# Patient Record
Sex: Female | Born: 1965 | Race: Black or African American | Hispanic: No | Smoking: Never smoker
Health system: Southern US, Community
[De-identification: ages and names within clinical notes are randomized; demographics above are authoritative.]

## PROBLEM LIST (undated history)

## (undated) DIAGNOSIS — M1990 Unspecified osteoarthritis, unspecified site: Secondary | ICD-10-CM

## (undated) DIAGNOSIS — E559 Vitamin D deficiency, unspecified: Secondary | ICD-10-CM

## (undated) DIAGNOSIS — J302 Other seasonal allergic rhinitis: Secondary | ICD-10-CM

## (undated) DIAGNOSIS — L309 Dermatitis, unspecified: Secondary | ICD-10-CM

## (undated) DIAGNOSIS — I10 Essential (primary) hypertension: Secondary | ICD-10-CM

## (undated) DIAGNOSIS — E669 Obesity, unspecified: Secondary | ICD-10-CM

## (undated) DIAGNOSIS — K219 Gastro-esophageal reflux disease without esophagitis: Secondary | ICD-10-CM

## (undated) DIAGNOSIS — E7800 Pure hypercholesterolemia, unspecified: Secondary | ICD-10-CM

## (undated) HISTORY — PX: TONSILLECTOMY: SUR1361

## (undated) HISTORY — DX: Dermatitis, unspecified: L30.9

## (undated) HISTORY — PX: TUBAL LIGATION: SHX77

---

## 2008-07-23 ENCOUNTER — Emergency Department (HOSPITAL_BASED_OUTPATIENT_CLINIC_OR_DEPARTMENT_OTHER): Admission: EM | Admit: 2008-07-23 | Discharge: 2008-07-23 | Payer: Self-pay | Admitting: Emergency Medicine

## 2009-12-04 ENCOUNTER — Emergency Department (HOSPITAL_BASED_OUTPATIENT_CLINIC_OR_DEPARTMENT_OTHER): Admission: EM | Admit: 2009-12-04 | Discharge: 2009-12-05 | Payer: Self-pay | Admitting: Emergency Medicine

## 2011-07-21 LAB — COMPREHENSIVE METABOLIC PANEL
BUN: 13
Calcium: 9.3
Chloride: 104
Creatinine, Ser: 0.8
GFR calc non Af Amer: >60
Glucose, Bld: 89
Potassium: 3.9

## 2011-07-21 LAB — URINALYSIS, ROUTINE W REFLEX MICROSCOPIC
Bilirubin Urine: NEGATIVE
Hgb urine dipstick: NEGATIVE
Nitrite: NEGATIVE
Protein, ur: NEGATIVE
Specific Gravity, Urine: 1.017
Urobilinogen, UA: 1

## 2011-07-21 LAB — DIFFERENTIAL
Basophils Absolute: 0
Eosinophils Relative: 1
Lymphocytes Relative: 42
Neutro Abs: 3.6

## 2011-07-21 LAB — CBC
HCT: 36.2
Platelets: 255
RDW: 13.1
WBC: 7.1

## 2011-07-21 LAB — POCT CARDIAC MARKERS: Troponin i, poc: <0.05

## 2011-07-21 LAB — MAGNESIUM: Magnesium: 2.1

## 2013-03-16 ENCOUNTER — Encounter (HOSPITAL_BASED_OUTPATIENT_CLINIC_OR_DEPARTMENT_OTHER): Payer: Self-pay | Admitting: Emergency Medicine

## 2013-03-16 ENCOUNTER — Emergency Department (HOSPITAL_BASED_OUTPATIENT_CLINIC_OR_DEPARTMENT_OTHER): Payer: BC Managed Care – PPO

## 2013-03-16 ENCOUNTER — Emergency Department (HOSPITAL_BASED_OUTPATIENT_CLINIC_OR_DEPARTMENT_OTHER)
Admission: EM | Admit: 2013-03-16 | Discharge: 2013-03-16 | Disposition: A | Discharge status: Home / Self Care | Payer: BC Managed Care – PPO | Attending: Emergency Medicine | Admitting: Emergency Medicine

## 2013-03-16 DIAGNOSIS — R0989 Other specified symptoms and signs involving the circulatory and respiratory systems: Secondary | ICD-10-CM | POA: Insufficient documentation

## 2013-03-16 DIAGNOSIS — M25529 Pain in unspecified elbow: Secondary | ICD-10-CM | POA: Insufficient documentation

## 2013-03-16 DIAGNOSIS — R079 Chest pain, unspecified: Secondary | ICD-10-CM | POA: Insufficient documentation

## 2013-03-16 DIAGNOSIS — Z79899 Other long term (current) drug therapy: Secondary | ICD-10-CM | POA: Insufficient documentation

## 2013-03-16 DIAGNOSIS — R062 Wheezing: Secondary | ICD-10-CM | POA: Insufficient documentation

## 2013-03-16 DIAGNOSIS — K219 Gastro-esophageal reflux disease without esophagitis: Secondary | ICD-10-CM | POA: Insufficient documentation

## 2013-03-16 DIAGNOSIS — Z8639 Personal history of other endocrine, nutritional and metabolic disease: Secondary | ICD-10-CM | POA: Insufficient documentation

## 2013-03-16 DIAGNOSIS — J40 Bronchitis, not specified as acute or chronic: Secondary | ICD-10-CM | POA: Insufficient documentation

## 2013-03-16 DIAGNOSIS — R0602 Shortness of breath: Secondary | ICD-10-CM | POA: Insufficient documentation

## 2013-03-16 HISTORY — DX: Vitamin D deficiency, unspecified: E55.9

## 2013-03-16 HISTORY — DX: Gastro-esophageal reflux disease without esophagitis: K21.9

## 2013-03-16 HISTORY — DX: Other seasonal allergic rhinitis: J30.2

## 2013-03-16 LAB — CBC WITH DIFFERENTIAL/PLATELET
Basophils Absolute: 0 10*3/uL (ref 0.0–0.1)
Eosinophils Absolute: 0.1 10*3/uL (ref 0.0–0.7)
Hemoglobin: 11.4 g/dL — ABNORMAL LOW (ref 12.0–15.0)
Lymphocytes Relative: 57 % — ABNORMAL HIGH (ref 12–46)
MCH: 29.1 pg (ref 26.0–34.0)
MCHC: 33.6 g/dL (ref 30.0–36.0)
Monocytes Absolute: 0.5 10*3/uL (ref 0.1–1.0)
Monocytes Relative: 8 % (ref 3–12)
Neutrophils Relative %: 33 % — ABNORMAL LOW (ref 43–77)
RBC: 3.92 MIL/uL (ref 3.87–5.11)
RDW: 13.3 % (ref 11.5–15.5)

## 2013-03-16 LAB — BASIC METABOLIC PANEL
BUN: 10 mg/dL (ref 6–23)
Chloride: 102 mEq/L (ref 96–112)
GFR calc Af Amer: >90 mL/min (ref >90)
Glucose, Bld: 104 mg/dL — ABNORMAL HIGH (ref 70–99)
Sodium: 138 mEq/L (ref 135–145)

## 2013-03-16 LAB — TROPONIN I: Troponin I: <0.3 ng/mL (ref <0.30)

## 2013-03-16 MED ORDER — HYDROCODONE-HOMATROPINE 5-1.5 MG/5ML PO SYRP: 5.0000 mL | ORAL_SOLUTION | Freq: Four times a day (QID) | ORAL | Status: DC | PRN

## 2013-03-16 MED ORDER — ALBUTEROL SULFATE HFA 108 (90 BASE) MCG/ACT IN AERS
2.0000 | INHALATION_SPRAY | RESPIRATORY_TRACT | Status: DC | PRN
  Administered 2013-03-16: 2 via RESPIRATORY_TRACT
  Filled 2013-03-16: qty 6.7

## 2013-03-16 NOTE — ED Notes (Signed)
MD at bedside giving results and plan of care.

## 2013-03-16 NOTE — ED Provider Notes (Signed)
History     CSN: [id number]  Arrival date & time 03/16/13  1722   First MD Initiated Contact with Patient 03/16/13 1738      Chief Complaint  Patient presents with  . Cough  . Chest Pain    (Consider location/radiation/quality/duration/timing/severity/associated sxs/prior treatment) Patient is a 47 y.o. Jennifer [name] presenting with cough and chest pain.  Cough Associated symptoms: chest pain   Chest Pain Associated symptoms: cough    Pt reports 2 weeks of chest congestion, dry cough and occasional SOB. She has not had fever, but today at work began to have moderate chest soreness, worse with deep breath and coughing. She denies any history of CAD. No HTN or DM. She also reports mild L elbow pain related to lifting at work, but not related to her chief complaint today.  Past Medical History  Diagnosis Date  . Vitamin D deficiency   . GERD (gastroesophageal reflux disease)   . Seasonal allergies     Past Surgical History  Procedure Laterality Date  . Cesarean section    . Tubal ligation      No family history on file.  History  Substance Use Topics  . Smoking status: Passive Smoke Exposure - Never Smoker  . Smokeless tobacco: Not on file  . Alcohol Use: No    OB History   Grav Para Term Preterm Abortions TAB SAB Ect Mult Living                  Review of Systems  Respiratory: Positive for cough.   Cardiovascular: Positive for chest pain.   All other systems reviewed and are negative except as noted in HPI.   Allergies  Review of patient's allergies indicates no known allergies.  Home Medications   Current Outpatient Rx  Name  Route  Sig  Dispense  Refill  . ranitidine (ZANTAC) 75 MG tablet   Oral   Take 75 mg by mouth daily.           BP 155/90  Pulse 88  Temp(Src) 98 F (36.7 C) (Oral)  Resp 24  Ht 4\' 11"  (1.499 m)  Wt 321 lb (145.605 kg)  BMI 64.8 kg/m2  SpO2 100%  LMP 02/21/2013  Physical Exam  Nursing note and vitals  reviewed. Constitutional: She is oriented to person, place, and time. She appears well-developed and well-nourished.  HENT:  Head: Normocephalic and atraumatic.  Eyes: EOM are normal. Pupils are equal, round, and reactive to light.  Neck: Normal range of motion. Neck supple.  Cardiovascular: Normal rate, normal heart sounds and intact distal pulses.   Pulmonary/Chest: Effort normal. She has wheezes. She has no rales.  Abdominal: Bowel sounds are normal. She exhibits no distension. There is no tenderness.  Musculoskeletal: Normal range of motion. She exhibits no edema and no tenderness.  Neurological: She is alert and oriented to person, place, and time. She has normal strength. No cranial nerve deficit or sensory deficit.  Skin: Skin is warm and dry. No rash noted.  Psychiatric: She has a normal mood and affect.    ED Course  Procedures (including critical care time)  Labs Reviewed  CBC WITH DIFFERENTIAL - Abnormal; Notable for the following:    Hemoglobin 11.4 (*)    HCT 33.9 (*)    Neutrophils Relative % 33 (*)    Lymphocytes Relative 57 (*)    All other components within normal limits  BASIC METABOLIC PANEL - Abnormal; Notable for the following:    Glucose,  Bld 104 (*)    All other components within normal limits  TROPONIN I   Dg Chest 2 View  03/16/2013   *RADIOLOGY REPORT*  Clinical Data: Dry cough.  Anterior chest pain.  CHEST - 2 VIEW  Comparison: 07/23/2008  Findings: Low lung volumes are present, causing crowding of the pulmonary vasculature.  Elevated cardiothoracic index at 56%, at least partially due to the low lung volumes.  No specific airspace opacity or pleural effusion identified.  IMPRESSION:  1.  Very low lung volumes likely contribute to the mildly prominent appearance of the heart.  Vascular crowding due to low lung volumes.  No specific acute findings.   Original Report Authenticated By: Gaylyn Rong, M.D.     1. Bronchitis       MDM   Date:  03/16/2013  Rate: 83  Rhythm: normal sinus rhythm  QRS Axis: normal  Intervals: normal  ST/T Wave abnormalities: normal  Conduction Disutrbances: none  Narrative Interpretation: unremarkable   Pt feeling much better after inhaler. Labs and imaging as above unremarkable. Likely a viral bronchitis. Given information regarding expected course, Albuterol if needed, cough medicine at night.         Charles B. Bernette Mayers, MD 03/16/13 2012

## 2013-03-16 NOTE — ED Notes (Signed)
Dry cough x2 weeks. Sharp Pain in upper sternum today. Worse with deep breath and cough.  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 Also some left elbow pain with movement of left arm.,

## 2013-03-25 DIAGNOSIS — M545 Low back pain: Secondary | ICD-10-CM | POA: Insufficient documentation

## 2013-07-14 DIAGNOSIS — K219 Gastro-esophageal reflux disease without esophagitis: Secondary | ICD-10-CM | POA: Insufficient documentation

## 2013-11-24 DIAGNOSIS — E559 Vitamin D deficiency, unspecified: Secondary | ICD-10-CM | POA: Insufficient documentation

## 2014-12-28 DIAGNOSIS — I10 Essential (primary) hypertension: Secondary | ICD-10-CM | POA: Insufficient documentation

## 2015-08-22 DIAGNOSIS — E7800 Pure hypercholesterolemia, unspecified: Secondary | ICD-10-CM | POA: Insufficient documentation

## 2016-06-26 ENCOUNTER — Encounter: Payer: 59 | Admitting: Allergy & Immunology

## 2016-06-26 ENCOUNTER — Encounter: Payer: Self-pay | Admitting: Allergy & Immunology

## 2016-06-26 ENCOUNTER — Ambulatory Visit (INDEPENDENT_AMBULATORY_CARE_PROVIDER_SITE_OTHER): Payer: 59 | Admitting: Allergy & Immunology

## 2016-06-26 VITALS — BP 134/82 | HR 71 | Temp 98.0°F | Resp 16 | Ht 60.04 in | Wt 315.0 lb

## 2016-06-26 DIAGNOSIS — L259 Unspecified contact dermatitis, unspecified cause: Secondary | ICD-10-CM

## 2016-06-26 DIAGNOSIS — J310 Chronic rhinitis: Secondary | ICD-10-CM | POA: Diagnosis not present

## 2016-06-26 DIAGNOSIS — L253 Unspecified contact dermatitis due to other chemical products: Secondary | ICD-10-CM | POA: Diagnosis not present

## 2016-06-26 DIAGNOSIS — Z91040 Latex allergy status: Secondary | ICD-10-CM

## 2016-06-26 MED ORDER — AZELASTINE-FLUTICASONE 137-50 MCG/ACT NA SUSP: 2.0000 | NASAL | 5 refills | Status: DC

## 2016-06-26 MED ORDER — EPINEPHRINE 0.3 MG/0.3ML IJ SOAJ: 0.3000 mg | Freq: Once | INTRAMUSCULAR | 2 refills | Status: AC

## 2016-06-26 NOTE — Patient Instructions (Addendum)
1. Latex allergy - Testing was positive to latex. - Avoid latex in all forms.  - Latex can cross react with certain fruits, so be careful when you eat them (bananas, avacadoes, etc) - EpiPen prescribed.  2. Chronic rhinitis - Testing was positive to grasses, weeds, trees, molds, dust mites, cat, dog, cockroach - Start Dymista one spray per nostril twice daily (contains nasal steroid and nasal antihistamine) - Continue cetirizine 10mg  daily.  3. Contact dermatitis - We will place the patch testing on Monday.  - Come back on Wednesday and next Friday for a reading.  - We will give you more information on any positives at that time.  4. Return in about 4 weeks (around 07/24/2016). Also come back next Monday (06/29/16) for placement, Wednesday (07/01/16), and Friday (07/03/16) for readings.   Please inform us of any Emergency Department visits, hospitalizations, or changes in symptoms. Call us before going to the ED for breathing or allergy symptoms since we might be able to fit you in for a sick visit. Feel free to contact us anytime with any questions, problems, or concerns.  It was a pleasure to meet you today!   Control of House Dust Mite Allergen  House dust mites play a major role in allergic asthma and rhinitis.  They occur in environments with high humidity wherever human skin, the food for dust mites is found. High levels have been detected in dust obtained from mattresses, pillows, carpets, upholstered furniture, bed covers, clothes and soft toys.  The principal allergen of the house dust mite is found in its feces.  A gram of dust may contain 1,000 mites and 250,000 fecal particles.  Mite antigen is easily measured in the air during house cleaning activities.    1. Encase mattresses, including the box spring, and pillow, in an air tight cover.  Seal the zipper end of the encased mattresses with wide adhesive tape. 2. Wash the bedding in water of 130 degrees Farenheit weekly.  Avoid  cotton comforters/quilts and flannel bedding: the most ideal bed covering is the dacron comforter. 3. Remove all upholstered furniture from the bedroom. 4. Remove carpets, carpet padding, rugs, and non-washable window drapes from the bedroom.  Wash drapes weekly or use plastic window coverings. 5. Remove all non-washable stuffed toys from the bedroom.  Wash stuffed toys weekly. 6. Have the room cleaned frequently with a vacuum cleaner and a damp dust-mop.  The patient should not be in a room which is being cleaned and should wait 1 hour after cleaning before going into the room. 7. Close and seal all heating outlets in the bedroom.  Otherwise, the room will become filled with dust-laden air.  An electric heater can be used to heat the room. 8. Reduce indoor humidity to less than 50%.  Do not use a humidifier.  Reducing Pollen Exposure  The American Academy of Allergy, Asthma and Immunology suggests the following steps to reduce your exposure to pollen during allergy seasons.    1. Do not hang sheets or clothing out to dry; pollen may collect on these items. 2. Do not mow lawns or spend time around freshly cut grass; mowing stirs up pollen. 3. Keep windows closed at night.  Keep car windows closed while driving. 4. Minimize morning activities outdoors, a time when pollen counts are usually at their highest. 5. Stay indoors as much as possible when pollen counts or humidity is high and on windy days when pollen tends to remain in the air longer.  6. Use air conditioning when possible.  Many air conditioners have filters that trap the pollen spores. 7. Use a HEPA room air filter to remove pollen form the indoor air you breathe.  Control of Dog or Cat Allergen  Avoidance is the best way to manage a dog or cat allergy. If you have a dog or cat and are allergic to dog or cats, consider removing the dog or cat from the home. If you have a dog or cat but don't want to find it a new home, or if your  family wants a pet even though someone in the household is allergic, here are some strategies that may help keep symptoms at bay:  1. Keep the pet out of your bedroom and restrict it to only a few rooms. Be advised that keeping the dog or cat in only one room will not limit the allergens to that room. 2. Don't pet, hug or kiss the dog or cat; if you do, wash your hands with soap and water. 3. High-efficiency particulate air (HEPA) cleaners run continuously in a bedroom or living room can reduce allergen levels over time. 4. Regular use of a high-efficiency vacuum cleaner or a central vacuum can reduce allergen levels. 5. Giving your dog or cat a bath at least once a week can reduce airborne allergen.   Control of Cockroach Allergen  Cockroach allergen has been identified as an important cause of acute attacks of asthma, especially in urban settings.  There are fifty-five species of cockroach that exist in the [place], however only three, the [place], [place] and Oriental species produce allergen that can affect patients with Asthma.  Allergens can be obtained from fecal particles, egg casings and secretions from cockroaches.    1. Remove food sources. 2. Reduce access to water. 3. Seal access and entry points. 4. Spray runways with 0.5-1% Diazinon or Chlorpyrifos 5. Blow boric acid power under stoves and refrigerator. 6. Place bait stations (hydramethylnon) at feeding sites.   Control of Mold Allergen  Mold and fungi can grow on a variety of surfaces provided certain temperature and moisture conditions exist.  Outdoor molds grow on plants, decaying vegetation and soil.  The major outdoor mold, Alternaria and Cladosporium, are found in very high numbers during hot and dry conditions.  Generally, a late Summer - Fall peak is seen for common outdoor fungal spores.  Rain will temporarily lower outdoor mold spore count, but counts rise rapidly when the rainy period ends.  The most important  indoor molds are Aspergillus and Penicillium.  Dark, humid and poorly ventilated basements are ideal sites for mold growth.  The next most common sites of mold growth are the bathroom and the kitchen.  Outdoor MicrosoftMold Control 1. Use air conditioning and keep windows closed 2. Avoid exposure to decaying vegetation. 3. Avoid leaf raking. 4. Avoid grain handling. 5. Consider wearing a face mask if working in moldy areas.  Indoor Mold Control 1. Maintain humidity below 50%. 2. Clean washable surfaces with 5% bleach solution. 3. Remove sources e.g. contaminated carpets.

## 2016-06-26 NOTE — Progress Notes (Addendum)
NEW PATIENT  Date of Service/Encounter:  06/26/16   Assessment:   Latex allergy  Chronic rhinitis  Contact dermatitis    Plan/Recommendations:    1. Latex allergy - Testing was positive to latex at the 1/100 dilution. - Avoid latex in all forms.  - Latex can cross react with certain fruits, so be careful when you eat them (bananas, avacadoes, etc) - Information on latex provided.  - EpiPen prescribed and training provided.   2. Chronic rhinitis - Testing was positive to grasses, weeds, trees, molds, dust mites, cat, dog, cockroach - Start Dymista one spray per nostril twice daily (contains nasal steroid and nasal antihistamine) - Continue cetirizine '10mg'$  daily. - Because of her multiple sensitizations, we discussed allergen immunotherapy. - She will consider immunotherapy and we will discuss further at the appointment in one month.  3. Contact dermatitis - We will place the patch testing on Monday.  - Come back on Wednesday and next Friday for a reading.  - We will give you more information on any positives at that time.  4. Return in about 4 weeks (around 07/24/2016). Also come back next Monday (06/29/16) for placement, Wednesday (07/01/16), and Friday (07/03/16) for readings.     Subjective:   Jennifer [name] is a 50 y.o. female presenting today for evaluation of  Chief Complaint  Patient presents with  . Allergic Reaction    latex,hair bonding glue  . Rash  .  Jennifer [name] has a history of the following: Patient Active Problem List   Diagnosis Date Noted  . Latex allergy 06/26/2016  . Chronic rhinitis 06/26/2016  . Contact dermatitis 06/26/2016  . Hypercholesteremia 08/22/2015  . Hypertension, essential 12/28/2014  . Morbid obesity ([place]) 11/24/2013  . Vitamin D deficiency 11/24/2013  . Esophageal reflux 07/14/2013  . Low back pain 03/25/2013    History obtained from: chart review and patient.  Jennifer [name] was referred by Reeves Dam, MD.       Jennifer [name] is a 50 y.o. female presenting for rash as well as allergic rhinitis symptoms. These symptoms have been occurring for several years. Jennifer [name] reports that she gets a rash with exposure to certain plastics. 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 With exposure to the gloves and the powder on them, she will develop a rash over her hands. She also developed a rash around her mouth when she blows up balloons. She has had shortness of breath with a couple of these episodes. They recently changed gloves at work and she does not have these reactions any longer. She also avoids balloons and other rubber items.  Jennifer [name] also reports rashes with various chemicals. Specifically, when she goes to the salon and gets her hair done, she will develop a rash on her scalp. She has found some makeups that cause no reactions, but she has reacted in the past. She endorses rashes with certain antibacterial soaps, including [place] Spring. In general, she reports that she has sensitive skin and does better with sensitive products or unscented products. She describes these rashes as pruritic and somewhat roughened. She treats with avoidance of triggers as well as hydrocortisone ointment as needed.  Jennifer [name] does have a history of allergy symptoms for the last 3-4 years. These symptoms seem to be worse in the spring and fall, although upon further questioning it seems that she has symptoms throughout the year. She takes cetirizine 10 mg daily as well as Flonase 1 spray  per nostril throughout the entire year.  Jennifer [name] has a history of hypertension. She has never needed an inhaler aside from one episode of bronchitis around 15 years ago. Otherwise, there is no history of other atopic diseases, including asthma, drug allergies, food allergies, environmental allergies, stinging insect allergies, or urticaria. There is no significant infectious history. Vaccinations are up to date.     Past Medical History: Patient Active Problem List   Diagnosis Date Noted  . Latex allergy 06/26/2016  . Chronic rhinitis 06/26/2016  . Contact dermatitis 06/26/2016  . Hypercholesteremia 08/22/2015  . Hypertension, essential 12/28/2014  . Morbid obesity ([place]) 11/24/2013  . Vitamin D deficiency 11/24/2013  . Esophageal reflux 07/14/2013  . Low back pain 03/25/2013    Medication List:    Medication List       Accurate as of 06/26/16  1:35 PM. Always use your most recent med list.          acetaminophen 500 MG tablet Commonly known as:  TYLENOL Frequency:   Dosage:0   MG  Instructions:  Note:1 tab by mouth as needed for pain every 4 hours (Tylenol),take with food.   cetirizine 10 MG chewable tablet Commonly known as:  ZYRTEC Frequency:   Dosage:0   MG  Instructions:  Note:1 tab by every evening for 10 days, the once tab in the evening as needed for allergies - DO NOT TAKE WITH LORATADINE   fluticasone 50 MCG/ACT nasal spray Commonly known as:  FLONASE Place 1 spray into both nostrils 2 (two) times daily.   hydrochlorothiazide 12.5 MG tablet Commonly known as:  HYDRODIURIL Take 12.5 mg by mouth.   HYDROcodone-homatropine 5-1.5 MG/5ML syrup Commonly known as:  HYCODAN Take 5 mLs by mouth every 6 (six) hours as needed for cough.   ibuprofen 200 MG tablet Commonly known as:  ADVIL,MOTRIN Take 200 mg by mouth.   VITAMIN D PO Take by mouth.       Birth History: non-contributory. She was born at term without complications.  Developmental History: Jennifer [name] has met all milestones on time.   Past Surgical History: Past Surgical History:  Procedure Laterality Date  . CESAREAN SECTION    . TUBAL LIGATION       Family History: Family History  Problem Relation Age of Onset  . Allergic rhinitis Sister   . Allergic rhinitis Sister   . Sinusitis Sister   . Angioedema Neg Hx   . Eczema Neg Hx   . Urticaria Neg Hx   . Immunodeficiency Neg Hx      Social  History: Jennifer [name] lives at home with her husband. Their house is 9 years old. There is limited in carpet in the main living areas. There is carpeting in the bedrooms. They have gas heating and central cooling. There are no animals inside or outside the home. They do not use plastic dust mite covers for the better pillows. There is no tobacco exposure. She works as a Chemical engineer. Her current employment has been 18 years same location, but she has worked at other facilities in the past.   Review of Systems: a 14-point review of systems is pertinent for what is mentioned in HPI.  Otherwise, all other systems were negative. Constitutional: negative other than that listed in the HPI Eyes: negative other than that listed in the HPI Ears, nose, mouth, throat, and face: negative other than that listed in the HPI Respiratory: negative other than that listed in the HPI Cardiovascular: negative other than that listed  in the HPI Gastrointestinal: negative other than that listed in the HPI Genitourinary: negative other than that listed in the HPI Integument: negative other than that listed in the HPI Hematologic: negative other than that listed in the HPI Musculoskeletal: negative other than that listed in the HPI Neurological: negative other than that listed in the HPI Allergy/Immunologic: negative other than that listed in the HPI    Objective:   Blood pressure 134/82, pulse 71, temperature 98 F (36.7 C), temperature source Oral, resp. rate 16, height 5' 0.04" (1.525 m), weight (!) 315 lb 0.6 oz (142.9 kg). Body mass index is 61.45 kg/m.   Physical Exam:  General: Alert, interactive, in no acute distress. Very pleasant and boisterous obese female. HEENT: TMs pearly gray, turbinates edematous and pale with clear discharge, post-pharynx moderately erythematous. Neck: Supple without thyromegaly. Adenopathy: no enlarged lymph nodes appreciated in the anterior cervical, occipital, axillary,  epitrochlear, inguinal, or popliteal regions Lungs: Clear to auscultation without wheezing, rhonchi or rales. No increased work of breathing. No crackles. CV: Normal S1, S2 without murmurs. Capillary refill <2 seconds. Pulses 2+ upper extremities. Abdomen: Nondistended, nontender. No guarding or rebound tenderness. Difficult exam due to body habitus. Skin: Warm and dry, without lesions or rashes. There are no active lesions or urticaria today. Extremities:  No clubbing, cyanosis or edema. Neuro:   Grossly intact. No focal deficits noted.  Diagnostic studies:   Allergy Studies:   Indoor/Outdoor Environmental Percutaneous Testing: Positive to grasses, dust mites  Indoor/Outdoor Environmental Intradermal Testing: Positive to [place] and [place] grass, weed mix, tree mix, mold mix 3, mold mix 4, cat, dog, and cockroach  Latex testing: equivocal to the 10/998 dilution and positive to the 1/100 dilution with adequate controls    Salvatore Marvel, MD FAAAAI [facility] [place]  ________________________________     Follow-up Note  RE: Jennifer [name] MRN: [id number] DOB: 1966-09-20 Date of Office Visit: 06/26/2016  Primary care provider: Reeves Dam, MD Referring provider: Reeves Dam, MD   [place] returns to the office today for the initial patch test interpretation, given suspected history of contact dermatitis.    Diagnostics:  TRUE TEST 48 hour reading: Mild erythema at #15 carba mix.  Plan:   TRUE TEST patient written avoidance information given on carba mix.   Continue to keep back clean and dry.   Follow-up in 2 days for final patch test reading/interpretation.     Follow-up Note  RE: Jennifer [name]     MRN: [id number]        DOB: 1965-12-13 Date of Office Visit: 06/26/2016  Primary care provider: Reeves Dam, MD Referring provider: Reeves Dam, MD   Jennifer [name] returns to the office today for the 96 hour patch test  interpretation, given suspected history of contact dermatitis.    Diagnostics:  TRUE TEST 96 hour reading: Mild erythema at #25 (diazolidnyl urea) and #29 (imidazolidinyl urea)  Plan:   TRUE TEST patient written avoidance information given on carba mix.   Ok to wash off adhesive.  Encouraged avoidance of particular triggers.   Salvatore Marvel, MD [facility]

## 2016-06-29 ENCOUNTER — Telehealth: Payer: Self-pay | Admitting: *Deleted

## 2016-06-29 ENCOUNTER — Encounter: Payer: 59 | Admitting: Pediatrics

## 2016-06-29 NOTE — Telephone Encounter (Signed)
Pt needed a letter sent to work stating she had an allergy to latex and needed to avoid latex gloves. Letter was faxed to her work at [phone].

## 2016-07-01 ENCOUNTER — Encounter: Payer: 59 | Admitting: Allergy and Immunology

## 2016-07-01 NOTE — Addendum Note (Signed)
Addended by: Vincent PeyerKING, MICHELE A on: 07/01/2016 09:13 AM   Modules accepted: Orders

## 2016-07-03 ENCOUNTER — Encounter: Payer: 59 | Admitting: Allergy & Immunology

## 2016-07-24 ENCOUNTER — Encounter: Payer: Self-pay | Admitting: Allergy & Immunology

## 2016-07-24 ENCOUNTER — Ambulatory Visit (INDEPENDENT_AMBULATORY_CARE_PROVIDER_SITE_OTHER): Payer: 59 | Admitting: Allergy & Immunology

## 2016-07-24 VITALS — BP 146/90 | HR 92 | Temp 98.0°F | Resp 16

## 2016-07-24 DIAGNOSIS — Z91040 Latex allergy status: Secondary | ICD-10-CM | POA: Diagnosis not present

## 2016-07-24 DIAGNOSIS — I10 Essential (primary) hypertension: Secondary | ICD-10-CM | POA: Diagnosis not present

## 2016-07-24 DIAGNOSIS — L253 Unspecified contact dermatitis due to other chemical products: Secondary | ICD-10-CM

## 2016-07-24 DIAGNOSIS — J309 Allergic rhinitis, unspecified: Secondary | ICD-10-CM | POA: Diagnosis not present

## 2016-07-24 MED ORDER — EPINEPHRINE 0.3 MG/0.3ML IJ SOAJ: INTRAMUSCULAR | 1 refills | Status: DC

## 2016-07-24 MED ORDER — TRIAMCINOLONE ACETONIDE 0.1 % EX OINT: TOPICAL_OINTMENT | Freq: Two times a day (BID) | CUTANEOUS | Status: AC

## 2016-07-24 NOTE — Patient Instructions (Addendum)
1. Latex allergy - Continue to avoid latex. - Take a picture of the ingredients of the box of the gloves you are using and email to me: [email or web address] - We will send in a prescription for AuviQ (epineprine).  2. Chronic rhinitis - Continue with the Flonase 1-2 sprays per nostril daily. - Continue with cetirizine 10mg  daily.  - No need for allergy shots at this time.   3. Return in about 6 months (around 01/22/2017).   Please inform us of any Emergency Department visits, hospitalizations, or changes in symptoms. Call us before going to the ED for breathing or allergy symptoms since we might be able to fit you in for a sick visit. Feel free to contact us anytime with any questions, problems, or concerns.  It was good to see you again!

## 2016-07-24 NOTE — Progress Notes (Signed)
FOLLOW UP  Date of Service/Encounter:  07/24/16   Assessment:   Latex allergy  Contact dermatitis due to chemicals  Chronic allergic rhinitis, unspecified seasonality, unspecified trigger   Hypertension - on diuretic (HCTZ)   Plan/Recommendations:   1. Latex allergy - Continue to avoid latex. - Take a picture of the ingredients of the box of the gloves you are using and email to me: [email or web address] - We will send in a prescription for AuviQ (epineprine) given her history of anaphylaxis to latex containing products. - I will discuss her situation with Dr. Melvern Sample to see what polymer would be best to use given her history of IgE mediated reactions to latex and her contact dermatitis to carba (which is na ingredient within nitrile gloves).  2. Chronic rhinitis - Continue with the Flonase 1-2 sprays per nostril daily. - Continue with cetirizine 10mg  daily.  - No need for allergy shots at this time.   3. Hypertension - Jennifer [name] is already on a diuretic.  - She denies any problems with dizziness, vision changes, headaches, or palpitations.  - She does have an appointment with her PCP soon.  4. Return in about 6 months (around 01/22/2017).     Subjective:   Jennifer [name] is a 50 y.o. female presenting today for follow up of  Chief Complaint  Patient presents with  . Allergies    follow up  .  Jennifer [name] has a history of the following: Patient Active Problem List   Diagnosis Date Noted  . Latex allergy 06/26/2016  . Chronic rhinitis 06/26/2016  . Contact dermatitis 06/26/2016  . Hypercholesteremia 08/22/2015  . Hypertension, essential 12/28/2014  . Morbid obesity (HCC) 11/24/2013  . Vitamin D deficiency 11/24/2013  . Esophageal reflux 07/14/2013  . Low back pain 03/25/2013    History obtained from: chart review and patient.  Jennifer [name] was referred by Jennifer Caroli, MD.     Jennifer [name] is a 50 y.o. female presenting for a follow up  visit for contact dermatitis and latex allergy. She was last seen around one month ago for both environmental allergy testing as well as latex allergy testing. Environmental allergy testing was notable for grasses, dust mites, grasses, weeds, trees, mold 3/4, cat, dog, and cockroach. She was positive to latex via skin testing. Patch testing revealed sensitizations to carba mix, diazolidnyl urea, and imidazolidinyl urea.   Since the last visit, Jennifer [name] has done quite well. 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 She is unsure what the plastic kitchen gloves are made out of. She continues to have dry cracked hands, but overall they are better than before. She does use moisturizing only as needed, typically using Vaseline she does not have a topical steroids.  Jennifer [name] has been very careful about reading labels. She avoids all latex containing products. She does not have an epinephrine autoinjector. Typically, her reactions are limited only to her skin. However there was one episode when she used landing glue for her hair. This resulted in shortness of breath, swelling, rash and she needed to be rushed to the emergency room.  Jennifer [name]'s allergies are well controlled with Flonase 2 sprays per nostril daily as well as cetirizine 10 mg daily. She can tell when she does not take her cetirizine. She is currently getting her Flonase over-the-counter and currently pays around $20 per month for that. She is not interested in allergy shots at this  time.  Otherwise, there have been no changes to the past medical history, surgical history, family history, or social history.     Review of Systems: a 14-point review of systems is pertinent for what is mentioned in HPI.  Otherwise, all other systems were negative. Constitutional: negative other than that listed in the HPI Eyes: negative other than that listed in the HPI Ears,  nose, mouth, throat, and face: negative other than that listed in the HPI Respiratory: negative other than that listed in the HPI Cardiovascular: negative other than that listed in the HPI Gastrointestinal: negative other than that listed in the HPI Genitourinary: negative other than that listed in the HPI Integument: negative other than that listed in the HPI Hematologic: negative other than that listed in the HPI Musculoskeletal: negative other than that listed in the HPI Neurological: negative other than that listed in the HPI Allergy/Immunologic: negative other than that listed in the HPI    Objective:   Blood pressure (!) 146/90, pulse 92, temperature 98 F (36.7 C), temperature source Oral, resp. rate 16, SpO2 96 %. There is no height or weight on file to calculate BMI.   Physical Exam:  General: Alert, interactive, in no acute distress. Very pleasant, obese female. Infectious smile. HEENT: TMs pearly gray, turbinates edematous and pale with clear discharge, post-pharynx moderately erythematous. Neck:   Supple without thyromegaly. Lungs: Clear to auscultation without wheezing, rhonchi or rales. No increased work of breathing. No crackles. CV:      Normal S1, S2 without murmurs. Capillary refill <2 seconds. Pulses 2+ upper extremities. Abdomen: Nondistended, nontender. No guarding or rebound tenderness. Difficult exam due to body habitus. Skin:    Warm and dry, without lesions or rashes. There are no active lesions or urticaria today. Hands bilaterally are very dry but not cracked or bleeding. Extremities:  No clubbing, cyanosis or edema. Neuro:   Grossly intact. No focal deficits noted.   Diagnostic studies: None    Malachi BondsJoel Andric Kerce, MD [facility]

## 2016-10-07 NOTE — Addendum Note (Signed)
Addended by: Vincent PeyerKING, MICHELE A on: 10/07/2016 08:59 AM   Modules accepted: Orders

## 2017-01-22 ENCOUNTER — Ambulatory Visit: Payer: 59 | Admitting: Allergy & Immunology

## 2017-05-14 ENCOUNTER — Emergency Department (HOSPITAL_BASED_OUTPATIENT_CLINIC_OR_DEPARTMENT_OTHER): Payer: 59

## 2017-05-14 ENCOUNTER — Emergency Department (HOSPITAL_BASED_OUTPATIENT_CLINIC_OR_DEPARTMENT_OTHER)
Admission: EM | Admit: 2017-05-14 | Discharge: 2017-05-14 | Disposition: A | Discharge status: Home / Self Care | Payer: 59 | Attending: Emergency Medicine | Admitting: Emergency Medicine

## 2017-05-14 ENCOUNTER — Encounter (HOSPITAL_BASED_OUTPATIENT_CLINIC_OR_DEPARTMENT_OTHER): Payer: Self-pay | Admitting: Emergency Medicine

## 2017-05-14 DIAGNOSIS — R42 Dizziness and giddiness: Secondary | ICD-10-CM

## 2017-05-14 DIAGNOSIS — Z79899 Other long term (current) drug therapy: Secondary | ICD-10-CM | POA: Insufficient documentation

## 2017-05-14 DIAGNOSIS — Z7722 Contact with and (suspected) exposure to environmental tobacco smoke (acute) (chronic): Secondary | ICD-10-CM | POA: Insufficient documentation

## 2017-05-14 DIAGNOSIS — Z91040 Latex allergy status: Secondary | ICD-10-CM | POA: Insufficient documentation

## 2017-05-14 DIAGNOSIS — I10 Essential (primary) hypertension: Secondary | ICD-10-CM | POA: Insufficient documentation

## 2017-05-14 HISTORY — DX: Essential (primary) hypertension: I10

## 2017-05-14 HISTORY — DX: Unspecified osteoarthritis, unspecified site: M19.90

## 2017-05-14 LAB — URINALYSIS, ROUTINE W REFLEX MICROSCOPIC
BILIRUBIN URINE: NEGATIVE
GLUCOSE, UA: NEGATIVE mg/dL
Hgb urine dipstick: NEGATIVE
KETONES UR: NEGATIVE mg/dL
LEUKOCYTES UA: NEGATIVE
Nitrite: NEGATIVE
PROTEIN: NEGATIVE mg/dL
Specific Gravity, Urine: 1.017 (ref 1.005–1.030)
pH: 7.5 (ref 5.0–8.0)

## 2017-05-14 LAB — BASIC METABOLIC PANEL
Anion gap: 7 (ref 5–15)
BUN: 9 mg/dL (ref 6–20)
CALCIUM: 8.9 mg/dL (ref 8.9–10.3)
CO2: 26 mmol/L (ref 22–32)
CREATININE: 0.87 mg/dL (ref 0.44–1.00)
Chloride: 106 mmol/L (ref 101–111)
Glucose, Bld: 68 mg/dL (ref 65–99)
Potassium: 3.9 mmol/L (ref 3.5–5.1)
SODIUM: 139 mmol/L (ref 135–145)

## 2017-05-14 LAB — TROPONIN I

## 2017-05-14 LAB — CBC
HEMATOCRIT: 33.9 % — AB (ref 36.0–46.0)
Hemoglobin: 11.4 g/dL — ABNORMAL LOW (ref 12.0–15.0)
MCH: 29.8 pg (ref 26.0–34.0)
MCHC: 33.6 g/dL (ref 30.0–36.0)
MCV: 88.5 fL (ref 78.0–100.0)
PLATELETS: 219 10*3/uL (ref 150–400)
RBC: 3.83 MIL/uL — ABNORMAL LOW (ref 3.87–5.11)
RDW: 13.4 % (ref 11.5–15.5)
WBC: 4.7 10*3/uL (ref 4.0–10.5)

## 2017-05-14 LAB — I-STAT CHEM 8, ED
BUN: 11 mg/dL (ref 6–20)
CALCIUM ION: 1.13 mmol/L — AB (ref 1.15–1.40)
CREATININE: 0.8 mg/dL (ref 0.44–1.00)
Chloride: 106 mmol/L (ref 101–111)
GLUCOSE: 99 mg/dL (ref 65–99)
HCT: 33 % — ABNORMAL LOW (ref 36.0–46.0)
Hemoglobin: 11.2 g/dL — ABNORMAL LOW (ref 12.0–15.0)
POTASSIUM: 4 mmol/L (ref 3.5–5.1)
Sodium: 141 mmol/L (ref 135–145)
TCO2: 26 mmol/L (ref 0–100)

## 2017-05-14 MED ORDER — MECLIZINE HCL 25 MG PO TABS: 25.0000 mg | ORAL_TABLET | Freq: Three times a day (TID) | ORAL | 0 refills | Status: DC | PRN

## 2017-05-14 MED ORDER — MECLIZINE HCL 32 MG PO TABS: 32.0000 mg | ORAL_TABLET | Freq: Three times a day (TID) | ORAL | 0 refills | Status: DC | PRN

## 2017-05-14 MED FILL — MECLIZINE 25 MG TABLET: 25 | 10 days supply | Qty: 30 | Fill #0

## 2017-05-14 NOTE — ED Triage Notes (Signed)
Pt states the room was spinning when she woke up at 6 this morning causing her to fall backwards back onto the bed. Pt also reports some R side chest pain and nausea. Pt states she is still dizzy but was able to drive herself here.

## 2017-05-14 NOTE — ED Notes (Signed)
ED Provider at bedside. 

## 2017-05-14 NOTE — ED Provider Notes (Signed)
[facility] Provider Note   CSN: [id number] Arrival date & time: 05/14/17  0935     History   Chief Complaint Chief Complaint  Patient presents with  . Dizziness    HPI Jennifer GraffBarbara [name] is a 51 y.o. female with a past medical history significant for HTN, obesity and GERD who presents today with dizziness. Patient reports this morning she woke up and the room was spinning, she also reports some chest tightness and nausea associated with chief complaint. Patient laid in bed for about one and a half hour with some improvement in her symptoms. Patient was able to go to work nut reports still felt som mild dizziness and had  Had the impression that she was walking on air. At work patient continue to not feel like well and decided to come to the ED. Patient denies any cough, fever, chills, vomiting, abdominal pain, any vision changes, ear pain or headache.  HPI  Past Medical History:  Diagnosis Date  . Arthritis   . Eczema   . GERD (gastroesophageal reflux disease)   . Hypertension   . Seasonal allergies   . Vitamin D deficiency     Patient Active Problem List   Diagnosis Date Noted  . Latex allergy 06/26/2016  . Chronic rhinitis 06/26/2016  . Contact dermatitis 06/26/2016  . Hypercholesteremia 08/22/2015  . Hypertension, essential 12/28/2014  . Morbid obesity (HCC) 11/24/2013  . Vitamin D deficiency 11/24/2013  . Esophageal reflux 07/14/2013  . Low back pain 03/25/2013    Past Surgical History:  Procedure Laterality Date  . CESAREAN SECTION    . TONSILLECTOMY    . TUBAL LIGATION      OB History    No data available       Home Medications    Prior to Admission medications   Medication Sig Start Date End Date Taking? Authorizing Provider  cetirizine (ZYRTEC) 10 MG tablet Take 10 mg by mouth daily.   Yes [provider]  hydrochlorothiazide (HYDRODIURIL) 12.5 MG tablet Take 12.5 mg by mouth. 01/02/16 05/14/17 Yes [provider]    ranitidine (ZANTAC) 150 MG tablet Take 150 mg by mouth 2 (two) times daily.   Yes [provider]  acetaminophen (TYLENOL) 500 MG tablet Frequency:   Dosage:0   MG  Instructions:  Note:1 tab by mouth as needed for pain every 4 hours (Tylenol),take with food. 07/14/13   [provider]  Azelastine-Fluticasone (DYMISTA) 137-50 MCG/ACT SUSP Place 2 sprays into both nostrils 1 day or 1 dose. Patient not taking: Reported on 07/24/2016 06/26/16   Alfonse SpruceGallagher, Joel Louis, MD  Cholecalciferol (VITAMIN D PO) Take by mouth.    [provider]  EPINEPHrine (AUVI-Q) 0.3 mg/0.3 mL IJ SOAJ injection Use as directed for severe allergic reaction 07/24/16   Alfonse SpruceGallagher, Joel Louis, MD  fluticasone [facility](FLONASE) 50 MCG/ACT nasal spray Place 1 spray into both nostrils 2 (two) times daily.    [provider]  ibuprofen (ADVIL,MOTRIN) 200 MG tablet Take 200 mg by mouth. 04/04/14   [provider]  meclizine (ANTIVERT) 25 MG tablet Take 1 tablet (25 mg total) by mouth 3 (three) times daily as needed for dizziness. 05/14/17   Mesner, Brytnee CowerJason, MD    Family History Family History  Problem Relation Age of Onset  . Allergic rhinitis Sister   . Allergic rhinitis Sister   . Sinusitis Sister   . Angioedema Neg Hx   . Eczema Neg Hx   . Urticaria Neg Hx   .  Immunodeficiency Neg Hx     Social History Social History  Substance Use Topics  . Smoking status: Passive Smoke Exposure - Never Smoker  . Smokeless tobacco: Never Used  . Alcohol use No     Allergies   Latex   Review of Systems Review of Systems  Constitutional: Negative.   HENT: Negative.   Eyes: Negative.   Respiratory: Negative.   Cardiovascular:       Chest pressure    Gastrointestinal: Positive for nausea.  Endocrine: Negative.   Genitourinary: Negative.   Musculoskeletal: Negative.   Allergic/Immunologic: Negative.   Neurological: Positive for dizziness.  Hematological: Negative.   Psychiatric/Behavioral:  Negative.      Physical Exam Updated Vital Signs BP (!) 145/67   Pulse 70   Temp 98.1 F (36.7 C)   Resp 19   Ht 5\' 1"  (1.549 m)   Wt (!) 147 kg (324 lb)   LMP 04/07/2017   SpO2 98%   BMI 61.22 kg/m   Physical Exam  Constitutional: She is oriented to person, place, and time. She appears well-developed.  HENT:  Head: Normocephalic and atraumatic.  Eyes: Pupils are equal, round, and reactive to light. EOM are normal.  Neck: Normal range of motion. Neck supple.  Cardiovascular: Normal rate, regular rhythm and normal heart sounds.   Pulmonary/Chest: Effort normal and breath sounds normal.  Abdominal: Soft. Bowel sounds are normal.  Musculoskeletal: Normal range of motion.  Neurological: She is alert and oriented to person, place, and time.  Skin: Skin is warm and dry.  Psychiatric: She has a normal mood and affect. Her behavior is normal.     ED Treatments / Results  Labs (all labs ordered are listed, but only abnormal results are displayed) Labs Reviewed  CBC - Abnormal; Notable for the following:       Result Value   RBC 3.83 (*)    Hemoglobin 11.4 (*)    HCT 33.9 (*)    All other components within normal limits  I-STAT CHEM 8, ED - Abnormal; Notable for the following:    Calcium, Ion 1.13 (*)    Hemoglobin 11.2 (*)    HCT 33.0 (*)    All other components within normal limits  URINALYSIS, ROUTINE W REFLEX MICROSCOPIC  TROPONIN I  BASIC METABOLIC PANEL    EKG  EKG Interpretation  Date/Time:  Friday May 14 2017 09:42:51 EDT Ventricular Rate:  73 PR Interval:    QRS Duration: 94 QT Interval:  416 QTC Calculation: 459 R Axis:   1 Text Interpretation:  Sinus rhythm Low voltage, precordial leads Abnormal R-wave progression, early transition Borderline T wave abnormalities No significant change since last tracing Confirmed by Marily Memos [phone]) on 05/14/2017 10:04:17 AM       Radiology Dg Chest 2 View  Result Date: 05/14/2017 CLINICAL DATA:  Chest  pain this morning.  Shortness of breath. EXAM: CHEST  2 VIEW COMPARISON:  03/16/2013 FINDINGS: The heart size and mediastinal contours are within normal limits. Both lungs are clear. The visualized skeletal structures are unremarkable. IMPRESSION: No active cardiopulmonary disease. Electronically Signed   By: Elige Ko   On: 05/14/2017 10:24    Procedures Procedures (including critical care time)  Medications Ordered in ED Medications - No data to display   Initial Impression / Assessment and Plan / ED Course  I have reviewed the triage vital signs and the nursing notes.  Pertinent labs & imaging results that were available during my care of the  patient were reviewed by me and considered in my medical decision making (see chart for details).   Patient is [age] yo female who presented with dizziness, chest pressure and nausea. Patient initially reported dizziness and feeling that room is spinning. Initial symptoms and presentation consistent with vertigo with minimal concern with central vertigo given normal neurologic exam. Epley maneuver was not attempted given patient was asymptomatic at the time. Given Chest pressure and nausea in associating with dizziness EKG was done and showed NSR with no concern for arrhythmias. Troponin was negative (<0.03), CXR was unremarkable. Work up is less concerning for cardiac process. BMP , CBC and UA were all within normal limit. Patient passed swallow test prior to discharge. Presentation most consistent with peripheral vertigo, will discharge on meclizine 25 mg tid and epley maneuver instructions as needed. Patient know to follow up with ENT in the next few days if symptoms do not improve. Patient expresses understanding and is in agreement with plan.  Final Clinical Impressions(s) / ED Diagnoses   Final diagnoses:  Vertigo  Dizziness    New Prescriptions Discharge Medication List as of 05/14/2017 11:56 AM    START taking these medications   Details    meclizine (ANTIVERT) 32 MG tablet Take 1 tablet (32 mg total) by mouth 3 (three) times daily as needed., Starting Fri 05/14/2017, Print         Lovena Neighboursiallo, Burnetta Kohls, MD 05/14/17 1248    Mesner, Elleanor CowerJason, MD 05/14/17 1252

## 2017-05-14 NOTE — ED Notes (Signed)
Pt on cardiac monitor and auto VS 

## 2017-05-14 NOTE — Discharge Instructions (Signed)
If symptoms do not resolve in the next few days please make sure you follow up with your primary care provider for an ENT referral. If you continue to have chest pain, nausea, dizziness, shortness of breath please return to the ED.

## 2018-12-12 ENCOUNTER — Emergency Department (HOSPITAL_BASED_OUTPATIENT_CLINIC_OR_DEPARTMENT_OTHER)
Admission: EM | Admit: 2018-12-12 | Discharge: 2018-12-12 | Disposition: A | Discharge status: Home / Self Care | Payer: 59 | Attending: Emergency Medicine | Admitting: Emergency Medicine

## 2018-12-12 ENCOUNTER — Emergency Department (HOSPITAL_BASED_OUTPATIENT_CLINIC_OR_DEPARTMENT_OTHER): Payer: 59

## 2018-12-12 ENCOUNTER — Other Ambulatory Visit: Payer: Self-pay

## 2018-12-12 ENCOUNTER — Encounter (HOSPITAL_BASED_OUTPATIENT_CLINIC_OR_DEPARTMENT_OTHER): Payer: Self-pay | Admitting: Emergency Medicine

## 2018-12-12 DIAGNOSIS — Z79899 Other long term (current) drug therapy: Secondary | ICD-10-CM | POA: Insufficient documentation

## 2018-12-12 DIAGNOSIS — Z91040 Latex allergy status: Secondary | ICD-10-CM | POA: Insufficient documentation

## 2018-12-12 DIAGNOSIS — I10 Essential (primary) hypertension: Secondary | ICD-10-CM | POA: Insufficient documentation

## 2018-12-12 DIAGNOSIS — R079 Chest pain, unspecified: Secondary | ICD-10-CM | POA: Diagnosis present

## 2018-12-12 DIAGNOSIS — R0789 Other chest pain: Secondary | ICD-10-CM

## 2018-12-12 DIAGNOSIS — R630 Anorexia: Secondary | ICD-10-CM | POA: Diagnosis not present

## 2018-12-12 HISTORY — DX: Obesity, unspecified: E66.9

## 2018-12-12 HISTORY — DX: Pure hypercholesterolemia, unspecified: E78.00

## 2018-12-12 LAB — CBC WITH DIFFERENTIAL/PLATELET
ABS IMMATURE GRANULOCYTES: 0.01 10*3/uL (ref 0.00–0.07)
Basophils Absolute: 0 10*3/uL (ref 0.0–0.1)
Basophils Relative: 0 %
Eosinophils Absolute: 0.1 10*3/uL (ref 0.0–0.5)
Eosinophils Relative: 1 %
HEMATOCRIT: 38.3 % (ref 36.0–46.0)
Hemoglobin: 12 g/dL (ref 12.0–15.0)
IMMATURE GRANULOCYTES: 0 %
LYMPHS ABS: 2.2 10*3/uL (ref 0.7–4.0)
Lymphocytes Relative: 49 %
MCH: 28.9 pg (ref 26.0–34.0)
MCHC: 31.3 g/dL (ref 30.0–36.0)
MCV: 92.3 fL (ref 80.0–100.0)
MONOS PCT: 11 %
Monocytes Absolute: 0.5 10*3/uL (ref 0.1–1.0)
NEUTROS ABS: 1.8 10*3/uL (ref 1.7–7.7)
NEUTROS PCT: 39 %
PLATELETS: 251 10*3/uL (ref 150–400)
RBC: 4.15 MIL/uL (ref 3.87–5.11)
RDW: 13.3 % (ref 11.5–15.5)
WBC: 4.6 10*3/uL (ref 4.0–10.5)
nRBC: 0 % (ref 0.0–0.2)

## 2018-12-12 LAB — TROPONIN I
Troponin I: <0.03 ng/mL (ref <0.03)
Troponin I: <0.03 ng/mL (ref <0.03)

## 2018-12-12 LAB — COMPREHENSIVE METABOLIC PANEL
ALBUMIN: 3.7 g/dL (ref 3.5–5.0)
ALK PHOS: 78 U/L (ref 38–126)
ALT: 17 U/L (ref 0–44)
AST: 18 U/L (ref 15–41)
Anion gap: 6 (ref 5–15)
BILIRUBIN TOTAL: 0.9 mg/dL (ref 0.3–1.2)
BUN: 13 mg/dL (ref 6–20)
CALCIUM: 8.8 mg/dL — AB (ref 8.9–10.3)
CO2: 24 mmol/L (ref 22–32)
CREATININE: 0.77 mg/dL (ref 0.44–1.00)
Chloride: 105 mmol/L (ref 98–111)
GFR calc Af Amer: >60 mL/min (ref >60)
GLUCOSE: 92 mg/dL (ref 70–99)
POTASSIUM: 3.8 mmol/L (ref 3.5–5.1)
Sodium: 135 mmol/L (ref 135–145)
TOTAL PROTEIN: 7.5 g/dL (ref 6.5–8.1)

## 2018-12-12 LAB — LIPASE, BLOOD: LIPASE: 25 U/L (ref 11–51)

## 2018-12-12 MED ORDER — ASPIRIN 81 MG PO CHEW
324.0000 mg | CHEWABLE_TABLET | Freq: Once | ORAL | Status: AC
  Administered 2018-12-12: 324 mg via ORAL
  Filled 2018-12-12: qty 4

## 2018-12-12 NOTE — ED Triage Notes (Signed)
Sharp left upper chest pain with left jaw pain.  Started this morning at work.  No hx of CP.

## 2018-12-12 NOTE — ED Provider Notes (Signed)
[facility] EMERGENCY DEPARTMENT Provider Note   CSN: [id number] Arrival date & time: 12/12/18  1046    History   Chief Complaint Chief Complaint  Patient presents with  . Chest Pain    HPI Jennifer [name] is a 53 y.o. female.  HPI: A 53 year old patient with a history of hypertension, hypercholesterolemia and obesity presents for evaluation of chest pain. Initial onset of pain was approximately 1-3 hours ago. The patient's chest pain is sharp and is not worse with exertion. The patient's chest pain is middle- or left-sided, is not well-localized, is not described as heaviness/pressure/tightness and does radiate to the arms/jaw/neck. The patient does not complain of nausea and denies diaphoresis. The patient has a family history of coronary artery disease in a first-degree relative with onset less than age 54. The patient has no history of stroke, has no history of peripheral artery disease, has not smoked in the past 90 days and denies any history of treated diabetes.   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  She denied any shortness of breath but states she has not had much of an appetite this morning.  She has no unilateral leg pain or swelling.  She denies any unusual symptoms in her upper extremities.  She has no right jaw pain.  She states the pain started around 9:00 this morning and is better than it was but is persistent.  Patient states that in January she saw her PCP for her routine follow-up and was found to have elevated cholesterol and was started on medication.  She states her doctor also saw some abnormal EKG findings and wanted her to follow-up with her cardiologist.  She has a stress test scheduled next week.  She states she has had pain like this in the past but it has never lasted  as long as this.  She denies it occurring with exertion she has not had any cough, cold or congestion.  She does have a history of GERD but states they stopped making the antacid that she usually takes so she is only been taking Tums as needed.  The history is provided by the patient.  Chest Pain  Pain location:  L chest Risk factors comment:  Mom, dad and brother all had MIs resulting in stents in their 5750s   Past Medical History:  Diagnosis Date  . Arthritis   . Eczema   . GERD (gastroesophageal reflux disease)   . High cholesterol   . Hypertension   . Obesity   . Seasonal allergies   . Vitamin D deficiency     Patient Active Problem List   Diagnosis Date Noted  . Latex allergy 06/26/2016  . Chronic rhinitis 06/26/2016  . Contact dermatitis 06/26/2016  . Hypercholesteremia 08/22/2015  . Hypertension, essential 12/28/2014  . Morbid obesity (HCC) 11/24/2013  . Vitamin D deficiency 11/24/2013  . Esophageal reflux 07/14/2013  . Low back pain 03/25/2013    Past Surgical History:  Procedure Laterality Date  . CESAREAN SECTION    . TONSILLECTOMY    . TUBAL LIGATION       OB History   No obstetric history on file.      Home Medications    Prior to Admission medications   Medication Sig Start Date End Date Taking?  Authorizing Provider  acetaminophen (TYLENOL) 500 MG tablet Frequency:   Dosage:0   MG  Instructions:  Note:1 tab by mouth as needed for pain every 4 hours (Tylenol),take with food. 07/14/13   [provider]  Azelastine-Fluticasone (DYMISTA) 137-50 MCG/ACT SUSP Place 2 sprays into both nostrils 1 day or 1 dose. Patient not taking: Reported on 07/24/2016 06/26/16   Alfonse Spruce, MD  cetirizine (ZYRTEC) 10 MG tablet Take 10 mg by mouth daily.    [provider]  Cholecalciferol (VITAMIN D PO) Take by mouth.    [provider]  EPINEPHrine (AUVI-Q) 0.3 mg/0.3 mL IJ SOAJ injection Use as directed for severe allergic reaction  07/24/16   Alfonse Spruce, MD  fluticasone [facility]) 50 MCG/ACT nasal spray Place 1 spray into both nostrils 2 (two) times daily.    [provider]  hydrochlorothiazide (HYDRODIURIL) 12.5 MG tablet Take 12.5 mg by mouth. 01/02/16 05/14/17  [provider]  ibuprofen (ADVIL,MOTRIN) 200 MG tablet Take 200 mg by mouth. 04/04/14   [provider]  meclizine (ANTIVERT) 25 MG tablet Take 1 tablet (25 mg total) by mouth 3 (three) times daily as needed for dizziness. 05/14/17   Mesner, Emagene Cower, MD  ranitidine (ZANTAC) 150 MG tablet Take 150 mg by mouth 2 (two) times daily.    [provider]    Family History Family History  Problem Relation Age of Onset  . Allergic rhinitis Sister   . Allergic rhinitis Sister   . Sinusitis Sister   . Angioedema Neg Hx   . Eczema Neg Hx   . Urticaria Neg Hx   . Immunodeficiency Neg Hx     Social History Social History   Tobacco Use  . Smoking status: Never Smoker  . Smokeless tobacco: Never Used  Substance Use Topics  . Alcohol use: No  . Drug use: No     Allergies   Latex   Review of Systems Review of Systems  Cardiovascular: Positive for chest pain.  All other systems reviewed and are negative.    Physical Exam Updated Vital Signs BP (!) 147/68 (BP Location: Left Arm)   Pulse 75   Temp 98 F (36.7 C) (Oral)   Resp 16   Ht 5' (1.524 m)   Wt (!) 149.7 kg   LMP 04/07/2017   SpO2 100%   BMI 64.45 kg/m   Physical Exam Vitals signs and nursing note reviewed.  Constitutional:      General: She is not in acute distress.    Appearance: She is well-developed. She is obese.  HENT:     Head: Normocephalic and atraumatic.     Nose: Nose normal.     Mouth/Throat:     Mouth: Mucous membranes are moist.  Eyes:     Pupils: Pupils are equal, round, and reactive to light.  Cardiovascular:     Rate and Rhythm: Normal rate and regular rhythm.     Pulses: Normal pulses.     Heart sounds: Normal heart  sounds. No murmur. No friction rub.  Pulmonary:     Effort: Pulmonary effort is normal.     Breath sounds: Normal breath sounds. No wheezing or rales.  Abdominal:     General: Bowel sounds are normal. There is no distension.     Palpations: Abdomen is soft.     Tenderness: There is no abdominal tenderness. There is no guarding or rebound.  Musculoskeletal: Normal range of motion.        General:  No tenderness.     Right lower leg: No edema.     Left lower leg: No edema.     Comments: No edema  Skin:    General: Skin is warm and dry.     Capillary Refill: Capillary refill takes less than 2 seconds.     Findings: No rash.  Neurological:     General: No focal deficit present.     Mental Status: She is alert and oriented to person, place, and time. Mental status is at baseline.     Cranial Nerves: No cranial nerve deficit.  Psychiatric:        Behavior: Behavior normal.      ED Treatments / Results  Labs (all labs ordered are listed, but only abnormal results are displayed) Labs Reviewed  COMPREHENSIVE METABOLIC PANEL - Abnormal; Notable for the following components:      Result Value   Calcium 8.8 (*)    All other components within normal limits  CBC WITH DIFFERENTIAL/PLATELET  LIPASE, BLOOD  TROPONIN I  TROPONIN I    EKG EKG Interpretation  Date/Time:  Monday December 12 2018 11:01:50 EST Ventricular Rate:  72 PR Interval:  148 QRS Duration: 72 QT Interval:  422 QTC Calculation: 462 R Axis:   12 Text Interpretation:  Normal sinus rhythm Cannot rule out Anterior infarct , age undetermined No significant change since last tracing Confirmed by Gwyneth Sprout ([id number]) on 12/12/2018 11:25:12 AM   Radiology Dg Chest 2 View  Result Date: 12/12/2018 CLINICAL DATA:  Left-sided chest pain. EXAM: CHEST - 2 VIEW COMPARISON:  May 14, 2017 FINDINGS: The heart size and mediastinal contours are within normal limits. Both lungs are clear. The visualized skeletal structures are  unremarkable. IMPRESSION: No active cardiopulmonary disease. Electronically Signed   By: Gerome Sam III M.D   On: 12/12/2018 11:59    Procedures Procedures (including critical care time)  Medications Ordered in ED Medications  aspirin chewable tablet 324 mg (has no administration in time range)     Initial Impression / Assessment and Plan / ED Course  I have reviewed the triage vital signs and the nursing notes.  Pertinent labs & imaging results that were available during my care of the patient were reviewed by me and considered in my medical decision making (see chart for details).     HEAR Score: 3 Patient is a 53 year old female with multiple medical problems presenting today with left-sided chest pain that radiates to her jaw.  Some mild anorexia associated with it but no shortness of breath, vomiting.  The pain is still present but is very minimal now.  She has not taken any of her medications this morning because she forgot.  Symptoms did not start after eating.  She states she has not eaten because she did not have much of an appetite after the pain started.  She has not had any URI symptoms suggestive of pneumonia.  Low suspicion for dissection, PE, pneumonia.  Patient has no GI symptoms and has no abdominal pain here and low suspicion that this is gastritis, pancreatitis or hepatitis.  Patient has a significant family history of MI.  She has multiple risk factors for MI.  EKG today without specific findings.  Labs are pending.  Patient does have an elevated heart score of 3.  Patient was given aspirin here.  3:22 PM On reevaluation patient states that she feels much better.  Repeat EKG is unchanged and second troponin is negative.  Feel that is  reasonable to discharge patient home to follow-up next week with cardiology and stress testing.  Stressed the importance of returning if symptoms worsen.  Is supposed to be taking aspirin daily and encouraged her to do that as she did not  do that today and forgot to take her blood pressure medication.  Final Clinical Impressions(s) / ED Diagnoses   Final diagnoses:  Atypical chest pain    ED Discharge Orders    None       Gwyneth Sprout, MD 12/12/18 1536

## 2020-12-19 LAB — HM PAP SMEAR: HPV, high-risk: NEGATIVE

## 2020-12-19 LAB — RESULTS CONSOLE HPV: CHL HPV: NEGATIVE

## 2021-08-25 ENCOUNTER — Emergency Department (HOSPITAL_BASED_OUTPATIENT_CLINIC_OR_DEPARTMENT_OTHER): Payer: 59

## 2021-08-25 ENCOUNTER — Emergency Department (HOSPITAL_BASED_OUTPATIENT_CLINIC_OR_DEPARTMENT_OTHER)
Admission: EM | Admit: 2021-08-25 | Discharge: 2021-08-25 | Disposition: A | Discharge status: Home / Self Care | Payer: 59 | Attending: Emergency Medicine | Admitting: Emergency Medicine

## 2021-08-25 ENCOUNTER — Encounter (HOSPITAL_BASED_OUTPATIENT_CLINIC_OR_DEPARTMENT_OTHER): Payer: Self-pay

## 2021-08-25 ENCOUNTER — Other Ambulatory Visit: Payer: Self-pay

## 2021-08-25 DIAGNOSIS — J40 Bronchitis, not specified as acute or chronic: Secondary | ICD-10-CM | POA: Diagnosis not present

## 2021-08-25 DIAGNOSIS — I10 Essential (primary) hypertension: Secondary | ICD-10-CM | POA: Diagnosis not present

## 2021-08-25 DIAGNOSIS — Z91040 Latex allergy status: Secondary | ICD-10-CM | POA: Insufficient documentation

## 2021-08-25 DIAGNOSIS — R0789 Other chest pain: Secondary | ICD-10-CM | POA: Diagnosis present

## 2021-08-25 DIAGNOSIS — Z20822 Contact with and (suspected) exposure to covid-19: Secondary | ICD-10-CM | POA: Diagnosis not present

## 2021-08-25 LAB — BASIC METABOLIC PANEL
Anion gap: 8 (ref 5–15)
BUN: 9 mg/dL (ref 6–20)
CO2: 21 mmol/L — ABNORMAL LOW (ref 22–32)
Calcium: 8.6 mg/dL — ABNORMAL LOW (ref 8.9–10.3)
Chloride: 104 mmol/L (ref 98–111)
Creatinine, Ser: 0.82 mg/dL (ref 0.44–1.00)
GFR, Estimated: >60 mL/min (ref >60)
Glucose, Bld: 98 mg/dL (ref 70–99)
Potassium: 3.9 mmol/L (ref 3.5–5.1)
Sodium: 133 mmol/L — ABNORMAL LOW (ref 135–145)

## 2021-08-25 LAB — CBC
HCT: 37 % (ref 36.0–46.0)
Hemoglobin: 11.9 g/dL — ABNORMAL LOW (ref 12.0–15.0)
MCH: 29.2 pg (ref 26.0–34.0)
MCHC: 32.2 g/dL (ref 30.0–36.0)
MCV: 90.7 fL (ref 80.0–100.0)
Platelets: 240 10*3/uL (ref 150–400)
RBC: 4.08 MIL/uL (ref 3.87–5.11)
RDW: 13.6 % (ref 11.5–15.5)
WBC: 6.5 10*3/uL (ref 4.0–10.5)
nRBC: 0 % (ref 0.0–0.2)

## 2021-08-25 LAB — RESP PANEL BY RT-PCR (FLU A&B, COVID) ARPGX2
Influenza A by PCR: NEGATIVE
Influenza B by PCR: NEGATIVE
SARS Coronavirus 2 by RT PCR: NEGATIVE

## 2021-08-25 LAB — TROPONIN I (HIGH SENSITIVITY): Troponin I (High Sensitivity): 4 ng/L (ref <18)

## 2021-08-25 MED ORDER — ALBUTEROL SULFATE HFA 108 (90 BASE) MCG/ACT IN AERS
2.0000 | INHALATION_SPRAY | Freq: Once | RESPIRATORY_TRACT | Status: AC
  Administered 2021-08-25: 2 via RESPIRATORY_TRACT
  Filled 2021-08-25: qty 6.7

## 2021-08-25 NOTE — ED Provider Notes (Signed)
[facility] EMERGENCY DEPARTMENT Provider Note   CSN: [id number] Arrival date & time: 08/25/21  1408     History Chief Complaint  Patient presents with   Chest Pain   Cough    Jennifer [name] is a 55 y.o. female history of reflux, hypertension, high cholesterol, here presenting with chest pain and cough.  Patient states that she has been coughing since yesterday.  She had a negative COVID test this morning.  She is sent in by work to get a flu test.  She states that she has some chest pain when she coughs.  Denies any fevers.  The history is provided by the patient.      Past Medical History:  Diagnosis Date   Arthritis    Eczema    GERD (gastroesophageal reflux disease)    High cholesterol    Hypertension    Obesity    Seasonal allergies    Vitamin D deficiency     Patient Active Problem List   Diagnosis Date Noted   Latex allergy 06/26/2016   Chronic rhinitis 06/26/2016   Contact dermatitis 06/26/2016   Hypercholesteremia 08/22/2015   Hypertension, essential 12/28/2014   Morbid obesity (HCC) 11/24/2013   Vitamin D deficiency 11/24/2013   Esophageal reflux 07/14/2013   Low back pain 03/25/2013    Past Surgical History:  Procedure Laterality Date   CESAREAN SECTION     TONSILLECTOMY     TUBAL LIGATION       OB History   No obstetric history on file.     Family History  Problem Relation Age of Onset   Allergic rhinitis Sister    Allergic rhinitis Sister    Sinusitis Sister    Angioedema Neg Hx    Eczema Neg Hx    Urticaria Neg Hx    Immunodeficiency Neg Hx     Social History   Tobacco Use   Smoking status: Never   Smokeless tobacco: Never  Vaping Use   Vaping Use: Never used  Substance Use Topics   Alcohol use: No   Drug use: No    Home Medications Prior to Admission medications   Medication Sig Start Date End Date Taking? Authorizing Provider  acetaminophen (TYLENOL) 500 MG tablet Frequency:   Dosage:0   MG  Instructions:   Note:1 tab by mouth as needed for pain every 4 hours (Tylenol),take with food. 07/14/13   [provider]  Azelastine-Fluticasone (DYMISTA) 137-50 MCG/ACT SUSP Place 2 sprays into both nostrils 1 day or 1 dose. Patient not taking: Reported on 07/24/2016 06/26/16   Alfonse Spruce, MD  cetirizine (ZYRTEC) 10 MG tablet Take 10 mg by mouth daily.    [provider]  Cholecalciferol (VITAMIN D PO) Take by mouth.    [provider]  EPINEPHrine (AUVI-Q) 0.3 mg/0.3 mL IJ SOAJ injection Use as directed for severe allergic reaction 07/24/16   Alfonse Spruce, MD  fluticasone [facility]) 50 MCG/ACT nasal spray Place 1 spray into both nostrils 2 (two) times daily.    [provider]  hydrochlorothiazide (HYDRODIURIL) 12.5 MG tablet Take 12.5 mg by mouth. 01/02/16 05/14/17  [provider]  ibuprofen (ADVIL,MOTRIN) 200 MG tablet Take 200 mg by mouth. 04/04/14   [provider]  meclizine (ANTIVERT) 25 MG tablet Take 1 tablet (25 mg total) by mouth 3 (three) times daily as needed for dizziness. 05/14/17   Mesner, Jase Cower, MD  ranitidine (ZANTAC) 150 MG tablet Take 150 mg by mouth 2 (two) times daily.  [provider]    Allergies    Latex  Review of Systems   Review of Systems  Respiratory:  Positive for cough.   Cardiovascular:  Positive for chest pain.  All other systems reviewed and are negative.  Physical Exam Updated Vital Signs BP (!) 152/87   Pulse 71   Temp 98.5 F (36.9 C) (Oral)   Resp (!) 21   Ht 5\' 1"  (1.549 m)   Wt (!) 166.5 kg   LMP 04/07/2017   SpO2 96%   BMI 69.34 kg/m   Physical Exam Vitals and nursing note reviewed.  Constitutional:      Comments: Slightly uncomfortable   HENT:     Head: Normocephalic.  Eyes:     Extraocular Movements: Extraocular movements intact.     Pupils: Pupils are equal, round, and reactive to light.  Cardiovascular:     Rate and Rhythm: Normal rate and regular rhythm.      Heart sounds: Normal heart sounds.  Pulmonary:     Comments: Mild wheezing throughout, no crackles  Abdominal:     General: Bowel sounds are normal.     Palpations: Abdomen is soft.  Musculoskeletal:        General: Normal range of motion.     Cervical back: Normal range of motion and neck supple.  Skin:    General: Skin is warm.     Capillary Refill: Capillary refill takes less than 2 seconds.  Neurological:     General: No focal deficit present.     Mental Status: She is alert and oriented to person, place, and time.  Psychiatric:        Mood and Affect: Mood normal.        Behavior: Behavior normal.    ED Results / Procedures / Treatments   Labs (all labs ordered are listed, but only abnormal results are displayed) Labs Reviewed  CBC - Abnormal; Notable for the following components:      Result Value   Hemoglobin 11.9 (*)    All other components within normal limits  BASIC METABOLIC PANEL - Abnormal; Notable for the following components:   Sodium 133 (*)    CO2 21 (*)    Calcium 8.6 (*)    All other components within normal limits  RESP PANEL BY RT-PCR (FLU A&B, COVID) ARPGX2  TROPONIN I (HIGH SENSITIVITY)  TROPONIN I (HIGH SENSITIVITY)    EKG EKG Interpretation  Date/Time:  Monday August 25 2021 14:18:38 EST Ventricular Rate:  92 PR Interval:  138 QRS Duration: 72 QT Interval:  366 QTC Calculation: 452 R Axis:   25 Text Interpretation: Normal sinus rhythm Cannot rule out Anterior infarct , age undetermined Abnormal ECG No significant change since last tracing Confirmed by [id number]) on 08/25/2021 4:05:27 PM  Radiology DG Chest Portable 1 View  Result Date: 08/25/2021 CLINICAL DATA:  Chest pain and dizziness EXAM: PORTABLE CHEST 1 VIEW COMPARISON:  12/12/2018 FINDINGS: Mildly degraded exam due to AP portable technique and patient body habitus. Apical lordotic positioning. Midline trachea. Normal heart size for level of inspiration. No pleural  effusion or pneumothorax. Clear lungs. IMPRESSION: No acute cardiopulmonary disease. Electronically Signed   By: 12/14/2018 M.D.   On: 08/25/2021 14:43    Procedures Procedures   Medications Ordered in ED Medications  albuterol (VENTOLIN HFA) 108 (90 Base) MCG/ACT inhaler 2 puff (2 puffs Inhalation Given 08/25/21 1522)    ED Course  I have reviewed the triage vital  signs and the nursing notes.  Pertinent labs & imaging results that were available during my care of the patient were reviewed by me and considered in my medical decision making (see chart for details).    MDM Rules/Calculators/A&P                           Ahlana Slaydon is a 55 y.o. female here presenting with cough and chills.  I think likely viral syndrome including COVID versus flu versus RSV versus other respiratory viruses.  Also consider pneumonia.  Low suspicion for ACS and symptoms for 2 days 1 set of troponin is sufficient.  Plan to get CBC and CMP and troponin x1, COVID and flu test.  4:39 PM Labs unremarkable.  COVID and flu tests are negative.  Patient had minimal wheezing that improved with albuterol.  Likely has bronchitis.  Stable for discharge with albuterol    Final Clinical Impression(s) / ED Diagnoses Final diagnoses:  None    Rx / DC Orders ED Discharge Orders     None        Charlynne Pander, MD 08/25/21 1640

## 2021-08-25 NOTE — ED Triage Notes (Signed)
Pt c/o flu like sx x 3 days-right side CP x today-NAD-steady gait

## 2021-08-25 NOTE — Discharge Instructions (Signed)
Your COVID and flu tests are negative  Use albuterol every 4 hours as needed  See your doctor for follow  Return to ER if you have worse trouble breathing, shortness of breath, fever, cough

## 2021-12-01 ENCOUNTER — Encounter (HOSPITAL_BASED_OUTPATIENT_CLINIC_OR_DEPARTMENT_OTHER): Payer: Self-pay

## 2021-12-01 ENCOUNTER — Emergency Department (HOSPITAL_BASED_OUTPATIENT_CLINIC_OR_DEPARTMENT_OTHER): Payer: 59

## 2021-12-01 ENCOUNTER — Emergency Department (HOSPITAL_BASED_OUTPATIENT_CLINIC_OR_DEPARTMENT_OTHER)
Admission: EM | Admit: 2021-12-01 | Discharge: 2021-12-01 | Disposition: A | Discharge status: Home / Self Care | Payer: 59 | Attending: Emergency Medicine | Admitting: Emergency Medicine

## 2021-12-01 ENCOUNTER — Other Ambulatory Visit: Payer: Self-pay

## 2021-12-01 DIAGNOSIS — Z91040 Latex allergy status: Secondary | ICD-10-CM | POA: Diagnosis not present

## 2021-12-01 DIAGNOSIS — J45909 Unspecified asthma, uncomplicated: Secondary | ICD-10-CM | POA: Diagnosis not present

## 2021-12-01 DIAGNOSIS — Z79899 Other long term (current) drug therapy: Secondary | ICD-10-CM | POA: Diagnosis not present

## 2021-12-01 DIAGNOSIS — Z20822 Contact with and (suspected) exposure to covid-19: Secondary | ICD-10-CM | POA: Diagnosis not present

## 2021-12-01 DIAGNOSIS — R0602 Shortness of breath: Secondary | ICD-10-CM | POA: Diagnosis present

## 2021-12-01 LAB — CBC WITH DIFFERENTIAL/PLATELET
Abs Immature Granulocytes: 0.01 10*3/uL (ref 0.00–0.07)
Basophils Absolute: 0 10*3/uL (ref 0.0–0.1)
Basophils Relative: 0 %
Eosinophils Absolute: 0.1 10*3/uL (ref 0.0–0.5)
Eosinophils Relative: 1 %
HCT: 37 % (ref 36.0–46.0)
Hemoglobin: 12.2 g/dL (ref 12.0–15.0)
Immature Granulocytes: 0 %
Lymphocytes Relative: 55 %
Lymphs Abs: 2.2 10*3/uL (ref 0.7–4.0)
MCH: 29.1 pg (ref 26.0–34.0)
MCHC: 33 g/dL (ref 30.0–36.0)
MCV: 88.3 fL (ref 80.0–100.0)
Monocytes Absolute: 0.3 10*3/uL (ref 0.1–1.0)
Monocytes Relative: 7 %
Neutro Abs: 1.5 10*3/uL — ABNORMAL LOW (ref 1.7–7.7)
Neutrophils Relative %: 37 %
Platelets: 226 10*3/uL (ref 150–400)
RBC: 4.19 MIL/uL (ref 3.87–5.11)
RDW: 13.6 % (ref 11.5–15.5)
WBC: 4 10*3/uL (ref 4.0–10.5)
nRBC: 0 % (ref 0.0–0.2)

## 2021-12-01 LAB — RESP PANEL BY RT-PCR (FLU A&B, COVID) ARPGX2
Influenza A by PCR: NEGATIVE
Influenza B by PCR: NEGATIVE
SARS Coronavirus 2 by RT PCR: NEGATIVE

## 2021-12-01 LAB — BASIC METABOLIC PANEL
Anion gap: 8 (ref 5–15)
BUN: 8 mg/dL (ref 6–20)
CO2: 25 mmol/L (ref 22–32)
Calcium: 8.7 mg/dL — ABNORMAL LOW (ref 8.9–10.3)
Chloride: 102 mmol/L (ref 98–111)
Creatinine, Ser: 0.8 mg/dL (ref 0.44–1.00)
GFR, Estimated: >60 mL/min (ref >60)
Glucose, Bld: 98 mg/dL (ref 70–99)
Potassium: 3.8 mmol/L (ref 3.5–5.1)
Sodium: 135 mmol/L (ref 135–145)

## 2021-12-01 LAB — TROPONIN I (HIGH SENSITIVITY): Troponin I (High Sensitivity): 5 ng/L (ref <18)

## 2021-12-01 LAB — BRAIN NATRIURETIC PEPTIDE: B Natriuretic Peptide: 15.3 pg/mL (ref 0.0–100.0)

## 2021-12-01 MED ORDER — DOXYCYCLINE HYCLATE 100 MG PO CAPS: 100.0000 mg | ORAL_CAPSULE | Freq: Two times a day (BID) | ORAL | 0 refills | Status: AC

## 2021-12-01 MED ORDER — ALBUTEROL SULFATE HFA 108 (90 BASE) MCG/ACT IN AERS
2.0000 | INHALATION_SPRAY | Freq: Once | RESPIRATORY_TRACT | Status: AC
Start: 2021-12-01 — End: 2021-12-01
  Administered 2021-12-01: 2 via RESPIRATORY_TRACT
  Filled 2021-12-01: qty 6.7

## 2021-12-01 MED ORDER — PREDNISONE 10 MG (21) PO TBPK: ORAL_TABLET | Freq: Every day | ORAL | 0 refills | Status: DC

## 2021-12-01 MED ORDER — IPRATROPIUM-ALBUTEROL 0.5-2.5 (3) MG/3ML IN SOLN
3.0000 mL | Freq: Once | RESPIRATORY_TRACT | Status: AC
  Administered 2021-12-01: 3 mL via RESPIRATORY_TRACT
  Filled 2021-12-01: qty 3

## 2021-12-01 NOTE — ED Notes (Signed)
No repeat troponin level need at this time, per Dr. Stevie Kern

## 2021-12-01 NOTE — Discharge Instructions (Addendum)
The radiologist commented that you have a borderline enlarged heart on the x-ray.  I would strongly recommend discussing this finding with your primary care doctor and requesting an echocardiogram to further evaluate.  Recommend taking course of antibiotics and steroids.  If you develop any worsening in your breathing, any chest pain or other new concerning symptom, please come back to ER for reassessment.

## 2021-12-01 NOTE — ED Notes (Signed)
RT at bedside.

## 2021-12-01 NOTE — ED Provider Notes (Signed)
[facility] EMERGENCY DEPARTMENT Provider Note   CSN: [id number] Arrival date & time: 12/01/21  8182     History  Chief Complaint  Patient presents with   Shortness of Breath    Jennifer [name] is a 56 y.o. female.  Presenting to the emergency room with concern for cough, shortness of breath and fever.  Symptoms started Thursday, cough seeming to be progressive, today noted some increased shortness of breath as well as having fever at home.  Denies associated chest pain.  Reports that she has had similar episode when she was diagnosed with bronchitis previously.  She denies any known cardiac history.  Review of chart, history of obesity, BMI is 70.  Most recent outpatient visit for postmenopausal bleeding.  Last ER visit for bronchitis on 11/7.  HPI     Home Medications Prior to Admission medications   Medication Sig Start Date End Date Taking? Authorizing Provider  doxycycline (VIBRAMYCIN) 100 MG capsule Take 1 capsule (100 mg total) by mouth 2 (two) times daily for 7 days. 12/01/21 12/08/21 Yes Cameryn Schum, Quitman Livings, MD  predniSONE (STERAPRED UNI-PAK 21 TAB) 10 MG (21) TBPK tablet Take by mouth daily. Take 6 tabs by mouth daily  for 1 day, then 5 tabs for 1 day, then 4 tabs for 1 day, then 3 tabs for 1 day, 2 tabs for 1 day, then 1 tab by mouth daily for 1 day 12/01/21  Yes Brenton Joines, Quitman Livings, MD  acetaminophen (TYLENOL) 500 MG tablet Frequency:   Dosage:0   MG  Instructions:  Note:1 tab by mouth as needed for pain every 4 hours (Tylenol),take with food. 07/14/13   [provider]  Azelastine-Fluticasone (DYMISTA) 137-50 MCG/ACT SUSP Place 2 sprays into both nostrils 1 day or 1 dose. Patient not taking: Reported on 07/24/2016 06/26/16   Alfonse Spruce, MD  cetirizine (ZYRTEC) 10 MG tablet Take 10 mg by mouth daily.    [provider]  Cholecalciferol (VITAMIN D PO) Take by mouth.    [provider]  EPINEPHrine (AUVI-Q) 0.3 mg/0.3 mL IJ SOAJ  injection Use as directed for severe allergic reaction 07/24/16   Alfonse Spruce, MD  fluticasone [facility]) 50 MCG/ACT nasal spray Place 1 spray into both nostrils 2 (two) times daily.    [provider]  hydrochlorothiazide (HYDRODIURIL) 12.5 MG tablet Take 12.5 mg by mouth. 01/02/16 05/14/17  [provider]  ibuprofen (ADVIL,MOTRIN) 200 MG tablet Take 200 mg by mouth. 04/04/14   [provider]  meclizine (ANTIVERT) 25 MG tablet Take 1 tablet (25 mg total) by mouth 3 (three) times daily as needed for dizziness. 05/14/17   Mesner, Marylan Cower, MD  ranitidine (ZANTAC) 150 MG tablet Take 150 mg by mouth 2 (two) times daily.    [provider]      Allergies    Latex    Review of Systems   Review of Systems  Constitutional:  Positive for chills and fatigue. Negative for fever.  HENT:  Negative for ear pain and sore throat.   Eyes:  Negative for pain and visual disturbance.  Respiratory:  Positive for cough and shortness of breath.   Cardiovascular:  Negative for chest pain and palpitations.  Gastrointestinal:  Negative for abdominal pain and vomiting.  Genitourinary:  Negative for dysuria and hematuria.  Musculoskeletal:  Negative for arthralgias and back pain.  Skin:  Negative for color change and rash.  Neurological:  Negative for seizures and syncope.  All other systems reviewed and  are negative.  Physical Exam Updated Vital Signs BP (!) 151/82    Pulse 74    Temp 98.3 F (36.8 C) (Oral)    Resp 17    Ht 5' (1.524 m)    Wt (!) 163.3 kg    LMP 04/07/2017    SpO2 100%    BMI 70.31 kg/m  Physical Exam Vitals and nursing note reviewed.  Constitutional:      General: She is not in acute distress.    Appearance: She is well-developed.  HENT:     Head: Normocephalic and atraumatic.  Eyes:     Conjunctiva/sclera: Conjunctivae normal.  Cardiovascular:     Rate and Rhythm: Normal rate and regular rhythm.     Heart sounds: No murmur heard. Pulmonary:      Effort: Pulmonary effort is normal. No respiratory distress.     Breath sounds: Normal breath sounds.     Comments: Bilateral expiratory wheeze noted, no significant tachypnea or increased work of breathing Abdominal:     Palpations: Abdomen is soft.     Tenderness: There is no abdominal tenderness.  Musculoskeletal:        General: No swelling.     Cervical back: Neck supple.  Skin:    General: Skin is warm and dry.     Capillary Refill: Capillary refill takes less than 2 seconds.  Neurological:     Mental Status: She is alert.  Psychiatric:        Mood and Affect: Mood normal.    ED Results / Procedures / Treatments   Labs (all labs ordered are listed, but only abnormal results are displayed) Labs Reviewed  CBC WITH DIFFERENTIAL/PLATELET - Abnormal; Notable for the following components:      Result Value   Neutro Abs 1.5 (*)    All other components within normal limits  BASIC METABOLIC PANEL - Abnormal; Notable for the following components:   Calcium 8.7 (*)    All other components within normal limits  RESP PANEL BY RT-PCR (FLU A&B, COVID) [id number]  BRAIN NATRIURETIC PEPTIDE  TROPONIN I (HIGH SENSITIVITY)  TROPONIN I (HIGH SENSITIVITY)    EKG EKG Interpretation  Date/Time:  Monday December 01 2021 11:40:44 EST Ventricular Rate:  72 PR Interval:  138 QRS Duration: 88 QT Interval:  411 QTC Calculation: 450 R Axis:   9 Text Interpretation: Sinus rhythm Low voltage, precordial leads Confirmed by Marianna Fuss ([id number]) on 12/02/2021 7:30:41 AM  Radiology DG Chest Portable 1 View  Result Date: 12/01/2021 CLINICAL DATA:  Shortness of breath EXAM: PORTABLE CHEST 1 VIEW COMPARISON:  08/25/2021 FINDINGS: Underpenetrated AP portable examination. Mild cardiomegaly. Diffuse bilateral interstitial pulmonary opacity. The visualized skeletal structures are unremarkable. IMPRESSION: Underpenetrated AP portable examination. Mild cardiomegaly with diffuse bilateral interstitial  pulmonary opacity, likely edema. No focal airspace opacity. Electronically Signed   By: Jearld Lesch M.D.   On: 12/01/2021 10:20    Procedures Procedures    Medications Ordered in ED Medications  ipratropium-albuterol (DUONEB) 0.5-2.5 (3) MG/3ML nebulizer solution 3 mL (3 mLs Nebulization Given by Other 12/01/21 1124)  albuterol (VENTOLIN HFA) 108 (90 Base) MCG/ACT inhaler 2 puff (2 puffs Inhalation Given 12/01/21 1323)    ED Course/ Medical Decision Making/ A&P                           Medical Decision Making Amount and/or Complexity of Data Reviewed Labs: ordered. Radiology: ordered.  Risk Prescription drug management.  56 year old lady presenting to ER with concern for cough, fever and shortness of breath.  On physical exam patient appears well, on lung exam did note expiratory wheeze.  Checked COVID, CXR initially.  CXR reviewed independently, radiology concern for underpenetrated exam, noted mild cardiomegaly and diffuse interstitial pulmonary opacity likely edema but no focal opacity.  COVID negative.  Checked EKG, troponin, BNP.  EKG did not have acute ischemic changes.  Troponin was within normal limits and her BNP was within normal limits.  Patient has no leg swelling, she denies prior cardiac history and given the normal troponin and BNP, I have very low suspicion that the findings on CXR were from heart failure.  Also the poor penetration of the x-ray and body habitus may be contributing to the CXR findings.  Feel more likely that this is infectious in nature with component of reactive airway disease based on history.  Discussed the chest x-ray findings with patient.  Advised her to follow-up with her primary care doctor and to obtain echocardiogram on outpatient basis for further assessment.  Recommended course of antibiotics to cover possible bacterial source of infection but this may be viral respiratory in nature.  Additionally advised course of steroids.  Reviewed return  precautions and discharged home.  On reassessment patient has no ongoing tachypnea, hypoxia, tachycardia and she appears well and speaking full sentences, feel she can be managed in the outpatient setting at this time does not require admission.    After the discussed management above, the patient was determined to be safe for discharge.  The patient was in agreement with this plan and all questions regarding their care were answered.  ED return precautions were discussed and the patient will return to the ED with any significant worsening of condition.         Final Clinical Impression(s) / ED Diagnoses Final diagnoses:  Reactive airway disease without complication, unspecified asthma severity, unspecified whether persistent    Rx / DC Orders ED Discharge Orders          Ordered    doxycycline (VIBRAMYCIN) 100 MG capsule  2 times daily        12/01/21 1315    predniSONE (STERAPRED UNI-PAK 21 TAB) 10 MG (21) TBPK tablet  Daily        12/01/21 1315              Lucrezia Starch, MD 12/02/21 [id number]

## 2021-12-01 NOTE — ED Notes (Signed)
PT placed in gown and on monitor. Told to call out for assistance.

## 2021-12-01 NOTE — ED Triage Notes (Addendum)
Pt arrives ambulatory to ED with c/o SOB starting Thursday Pt reports taking 3 at home covid test, all were negative. Pt has dyspnea on exertion to ED. Pt also reports cough and fever. Pt reports she was seen here recently for same, had inhaler but is now out.

## 2021-12-11 ENCOUNTER — Encounter: Payer: Self-pay | Admitting: General Practice

## 2022-03-05 ENCOUNTER — Emergency Department (HOSPITAL_BASED_OUTPATIENT_CLINIC_OR_DEPARTMENT_OTHER): Payer: 59

## 2022-03-05 ENCOUNTER — Encounter (HOSPITAL_BASED_OUTPATIENT_CLINIC_OR_DEPARTMENT_OTHER): Payer: Self-pay

## 2022-03-05 ENCOUNTER — Emergency Department (HOSPITAL_BASED_OUTPATIENT_CLINIC_OR_DEPARTMENT_OTHER)
Admission: EM | Admit: 2022-03-05 | Discharge: 2022-03-05 | Disposition: A | Discharge status: Home / Self Care | Payer: 59 | Attending: Emergency Medicine | Admitting: Emergency Medicine

## 2022-03-05 ENCOUNTER — Other Ambulatory Visit: Payer: Self-pay

## 2022-03-05 DIAGNOSIS — Z91040 Latex allergy status: Secondary | ICD-10-CM | POA: Diagnosis not present

## 2022-03-05 DIAGNOSIS — I10 Essential (primary) hypertension: Secondary | ICD-10-CM | POA: Insufficient documentation

## 2022-03-05 DIAGNOSIS — R2242 Localized swelling, mass and lump, left lower limb: Secondary | ICD-10-CM | POA: Insufficient documentation

## 2022-03-05 DIAGNOSIS — M79605 Pain in left leg: Secondary | ICD-10-CM | POA: Diagnosis not present

## 2022-03-05 DIAGNOSIS — K2100 Gastro-esophageal reflux disease with esophagitis, without bleeding: Secondary | ICD-10-CM | POA: Insufficient documentation

## 2022-03-05 DIAGNOSIS — Y92009 Unspecified place in unspecified non-institutional (private) residence as the place of occurrence of the external cause: Secondary | ICD-10-CM | POA: Insufficient documentation

## 2022-03-05 DIAGNOSIS — Z79899 Other long term (current) drug therapy: Secondary | ICD-10-CM | POA: Insufficient documentation

## 2022-03-05 DIAGNOSIS — R079 Chest pain, unspecified: Secondary | ICD-10-CM

## 2022-03-05 DIAGNOSIS — M25512 Pain in left shoulder: Secondary | ICD-10-CM | POA: Diagnosis not present

## 2022-03-05 DIAGNOSIS — K219 Gastro-esophageal reflux disease without esophagitis: Secondary | ICD-10-CM

## 2022-03-05 LAB — CBC
HCT: 37.6 % (ref 36.0–46.0)
Hemoglobin: 12 g/dL (ref 12.0–15.0)
MCH: 29.2 pg (ref 26.0–34.0)
MCHC: 31.9 g/dL (ref 30.0–36.0)
MCV: 91.5 fL (ref 80.0–100.0)
Platelets: 235 10*3/uL (ref 150–400)
RBC: 4.11 MIL/uL (ref 3.87–5.11)
RDW: 13.6 % (ref 11.5–15.5)
WBC: 5.5 10*3/uL (ref 4.0–10.5)
nRBC: 0 % (ref 0.0–0.2)

## 2022-03-05 LAB — BASIC METABOLIC PANEL
Anion gap: 6 (ref 5–15)
BUN: 16 mg/dL (ref 6–20)
CO2: 25 mmol/L (ref 22–32)
Calcium: 8.6 mg/dL — ABNORMAL LOW (ref 8.9–10.3)
Chloride: 105 mmol/L (ref 98–111)
Creatinine, Ser: 0.88 mg/dL (ref 0.44–1.00)
GFR, Estimated: >60 mL/min (ref >60)
Glucose, Bld: 128 mg/dL — ABNORMAL HIGH (ref 70–99)
Potassium: 3.8 mmol/L (ref 3.5–5.1)
Sodium: 136 mmol/L (ref 135–145)

## 2022-03-05 LAB — TROPONIN I (HIGH SENSITIVITY): Troponin I (High Sensitivity): 4 ng/L (ref <18)

## 2022-03-05 MED ORDER — OMEPRAZOLE 20 MG PO CPDR: 20.0000 mg | DELAYED_RELEASE_CAPSULE | Freq: Two times a day (BID) | ORAL | 0 refills | Status: AC

## 2022-03-05 MED ORDER — ALUM & MAG HYDROXIDE-SIMETH 200-200-20 MG/5ML PO SUSP
15.0000 mL | Freq: Once | ORAL | Status: AC
  Administered 2022-03-05: 15 mL via ORAL
  Filled 2022-03-05: qty 30

## 2022-03-05 MED ORDER — FAMOTIDINE 20 MG PO TABS: 20.0000 mg | ORAL_TABLET | Freq: Two times a day (BID) | ORAL | 0 refills | Status: DC

## 2022-03-05 MED ORDER — PANTOPRAZOLE SODIUM 40 MG PO TBEC
40.0000 mg | DELAYED_RELEASE_TABLET | Freq: Once | ORAL | Status: AC
  Administered 2022-03-05: 40 mg via ORAL
  Filled 2022-03-05: qty 1

## 2022-03-05 MED ORDER — FAMOTIDINE IN NACL 20-0.9 MG/50ML-% IV SOLN
20.0000 mg | Freq: Once | INTRAVENOUS | Status: DC
Start: 2022-03-05 — End: 2022-03-05
  Filled 2022-03-05: qty 50

## 2022-03-05 NOTE — ED Triage Notes (Signed)
Patient arrives POV from c/o intermittent chest pain that started around 0230 this morning. Pt reports right arm pain for approx 1 week. Chest pain is "in the middle" and "feels like a stabbing pain." Pt states she was told she has and enlarged heart; hx of HTN. A&o x4, no acute distress.

## 2022-03-05 NOTE — ED Provider Notes (Signed)
[facility] EMERGENCY DEPARTMENT Provider Note   CSN: [id number] Arrival date & time: 03/05/22  0908     History  Chief Complaint  Patient presents with   Chest Pain    Jennifer [name] is a 56 y.o. female.  Pt is a 56 yo female with pmh of HTN, hyperlipidemia, and obesity presenting for chest pain. Pt admits to sternal chest pain, sharp, with radiation to the back, that started this morning. Denies sob, light headedness, dizziness, or diaphoresis. No prior hx of MI. Hx of acid reflex secondary to hiatal hernia. Pt reports left sided shoulder pain x 1 week. Pt also admits leg swelling, worse on the left. Denies prior DVT/PE, hormone use, prolonged immobilization, or recent surgeries/injuries.   The history is provided by the patient. No language interpreter was used.  Chest Pain Associated symptoms: no abdominal pain, no back pain, no cough, no fever, no palpitations, no shortness of breath and no vomiting       Home Medications Prior to Admission medications   Medication Sig Start Date End Date Taking? Authorizing Provider  famotidine (PEPCID) 20 MG tablet Take 1 tablet (20 mg total) by mouth 2 (two) times daily. 03/05/22  Yes Edwin Dada P, DO  omeprazole (PRILOSEC) 20 MG capsule Take 1 capsule (20 mg total) by mouth 2 (two) times daily before a meal for 14 days. 03/05/22 03/19/22 Yes Edwin Dada P, DO  acetaminophen (TYLENOL) 500 MG tablet Frequency:   Dosage:0   MG  Instructions:  Note:1 tab by mouth as needed for pain every 4 hours (Tylenol),take with food. 07/14/13   [provider]  Azelastine-Fluticasone (DYMISTA) 137-50 MCG/ACT SUSP Place 2 sprays into both nostrils 1 day or 1 dose. Patient not taking: Reported on 07/24/2016 06/26/16   Alfonse Spruce, MD  cetirizine (ZYRTEC) 10 MG tablet Take 10 mg by mouth daily.    [provider]  Cholecalciferol (VITAMIN D PO) Take by mouth.    [provider]  EPINEPHrine (AUVI-Q) 0.3 mg/0.3 mL IJ  SOAJ injection Use as directed for severe allergic reaction 07/24/16   Alfonse Spruce, MD  fluticasone [facility]) 50 MCG/ACT nasal spray Place 1 spray into both nostrils 2 (two) times daily.    [provider]  hydrochlorothiazide (HYDRODIURIL) 12.5 MG tablet Take 12.5 mg by mouth. 01/02/16 05/14/17  [provider]  ibuprofen (ADVIL,MOTRIN) 200 MG tablet Take 200 mg by mouth. 04/04/14   [provider]  meclizine (ANTIVERT) 25 MG tablet Take 1 tablet (25 mg total) by mouth 3 (three) times daily as needed for dizziness. 05/14/17   Mesner, Marriah Cower, MD  predniSONE (STERAPRED UNI-PAK 21 TAB) 10 MG (21) TBPK tablet Take by mouth daily. Take 6 tabs by mouth daily  for 1 day, then 5 tabs for 1 day, then 4 tabs for 1 day, then 3 tabs for 1 day, 2 tabs for 1 day, then 1 tab by mouth daily for 1 day 12/01/21   Milagros Loll, MD  ranitidine (ZANTAC) 150 MG tablet Take 150 mg by mouth 2 (two) times daily.    [provider]      Allergies    Latex    Review of Systems   Review of Systems  Constitutional:  Negative for chills and fever.  HENT:  Negative for ear pain and sore throat.   Eyes:  Negative for pain and visual disturbance.  Respiratory:  Negative for cough and shortness of breath.   Cardiovascular:  Positive for  chest pain and leg swelling. Negative for palpitations.  Gastrointestinal:  Negative for abdominal pain and vomiting.  Genitourinary:  Negative for dysuria and hematuria.  Musculoskeletal:  Negative for arthralgias and back pain.  Skin:  Negative for color change and rash.  Neurological:  Negative for seizures and syncope.  All other systems reviewed and are negative.  Physical Exam Updated Vital Signs BP (!) 129/59   Pulse 76   Temp 97.8 F (36.6 C) (Oral)   Resp 19   Ht 5' (1.524 m)   Wt (!) 170.1 kg   LMP 04/07/2017   SpO2 98%   BMI 73.26 kg/m  Physical Exam  ED Results / Procedures / Treatments   Labs (all labs ordered are  listed, but only abnormal results are displayed) Labs Reviewed  BASIC METABOLIC PANEL - Abnormal; Notable for the following components:      Result Value   Glucose, Bld 128 (*)    Calcium 8.6 (*)    All other components within normal limits  CBC  TROPONIN I (HIGH SENSITIVITY)  TROPONIN I (HIGH SENSITIVITY)    EKG EKG Interpretation  Date/Time:  Thursday Mar 05 2022 09:29:25 EDT Ventricular Rate:  93 PR Interval:  136 QRS Duration: 90 QT Interval:  363 QTC Calculation: 452 R Axis:   -3 Text Interpretation: Sinus rhythm Abnormal R-wave progression, early transition Confirmed by Edwin Dada ([id number]) on 03/05/2022 9:52:57 AM  Radiology US Venous Img Lower Unilateral Left  Result Date: 03/05/2022 CLINICAL DATA:  56 year old female with left lower extremity pain and swelling. Shortness of breath. EXAM: LEFT LOWER EXTREMITY VENOUS DOPPLER ULTRASOUND TECHNIQUE: Coree Brame-scale sonography with compression, as well as color and duplex ultrasound, were performed to evaluate the deep venous system(s) from the level of the common femoral vein through the popliteal and proximal calf veins. COMPARISON:  None Available. FINDINGS: VENOUS Normal compressibility of the common femoral, superficial femoral, and popliteal veins. Visualized portions of profunda femoral vein and great saphenous vein unremarkable. Left calf veins are poorly visible on grayscale ultrasound, although maintained Doppler flow is apparent (images 36-38). No filling defects to suggest DVT on grayscale or color Doppler imaging. Doppler waveforms show normal direction of venous flow, normal respiratory plasticity and response to augmentation. Limited views of the contralateral common femoral vein are unremarkable. OTHER None. Limitations: Large body habitus, obesity degrades image detail throughout. IMPRESSION: Degraded exam due to large body habitus but no evidence of left lower extremity deep venous thrombosis. Electronically Signed   By: Odessa Fleming M.D.   On: 03/05/2022 10:38   DG Chest Portable 1 View  Result Date: 03/05/2022 CLINICAL DATA:  Chest pain. EXAM: PORTABLE CHEST 1 VIEW COMPARISON:  December 01, 2021. FINDINGS: Stable cardiomediastinal silhouette. Both lungs are clear. The visualized skeletal structures are unremarkable. IMPRESSION: No active disease. Electronically Signed   By: Lupita Raider M.D.   On: 03/05/2022 10:47    Procedures Procedures    Medications Ordered in ED Medications  alum & mag hydroxide-simeth (MAALOX/MYLANTA) 200-200-20 MG/5ML suspension 15 mL (15 mLs Oral Given 03/05/22 1032)  pantoprazole (PROTONIX) EC tablet 40 mg (40 mg Oral Given 03/05/22 1032)    ED Course/ Medical Decision Making/ A&P                           Medical Decision Making Amount and/or Complexity of Data Reviewed Labs: ordered. Radiology: ordered.  Risk OTC drugs. Prescription drug management.   12:02 PM 55  yo female with pmh of HTN, hyperlipidemia, and obesity presenting for chest pain.  The patient's chest pain is not suggestive of pulmonary embolus, cardiac ischemia, aortic dissection, pericarditis, myocarditis, pulmonary embolism, pneumothorax, pneumonia, Zoster, or esophageal perforation, or other serious etiology.  Historically not abrupt in onset, tearing or ripping, pulses symmetric. EKG nonspecific for ischemia/infarction. No dysrhythmias, brugada, WPW, prolonged QT noted. [CXR reviewed and WNL] [Troponin negative x2. CXR reviewed. Labs without demonstration of acute pathology unless otherwise noted above.   Low HEART Score: 0-3 points (0.9-1.7% risk of MACE).] Given the extremely low risk of these diagnoses further testing and evaluation for these possibilities does not appear to be indicated at this time.   Patient symptoms improved after Pepcid and Maalox.  Possibly secondary to GERD.  Omeprazole and Pepcid sent to pharmacy and dietary modification sent home with patient.  Patient in no distress and  overall condition improved here in the ED. Detailed discussions were had with the patient regarding current findings, and need for close f/u with PCP or on call doctor. The patient has been instructed to return immediately if the symptoms worsen in any way for re-evaluation. Patient verbalized understanding and is in agreement with current care plan. All questions answered prior to discharge.         Final Clinical Impression(s) / ED Diagnoses Final diagnoses:  Chest pain, unspecified type  Gastroesophageal reflux disease, unspecified whether esophagitis present    Rx / DC Orders ED Discharge Orders          Ordered    omeprazole (PRILOSEC) 20 MG capsule  2 times daily before meals        03/05/22 1038    famotidine (PEPCID) 20 MG tablet  2 times daily        03/05/22 1200              Franne FortsGray, Alexandria Shiflett P, DO 03/05/22 1202

## 2022-07-24 LAB — HM MAMMOGRAPHY

## 2022-09-07 ENCOUNTER — Emergency Department (HOSPITAL_BASED_OUTPATIENT_CLINIC_OR_DEPARTMENT_OTHER): Payer: Commercial Managed Care - HMO

## 2022-09-07 ENCOUNTER — Emergency Department (HOSPITAL_BASED_OUTPATIENT_CLINIC_OR_DEPARTMENT_OTHER)
Admission: EM | Admit: 2022-09-07 | Discharge: 2022-09-07 | Disposition: A | Discharge status: Home / Self Care | Payer: Commercial Managed Care - HMO | Attending: Emergency Medicine | Admitting: Emergency Medicine

## 2022-09-07 ENCOUNTER — Other Ambulatory Visit: Payer: Self-pay

## 2022-09-07 DIAGNOSIS — M25522 Pain in left elbow: Secondary | ICD-10-CM

## 2022-09-07 DIAGNOSIS — I10 Essential (primary) hypertension: Secondary | ICD-10-CM | POA: Diagnosis not present

## 2022-09-07 DIAGNOSIS — Z91040 Latex allergy status: Secondary | ICD-10-CM | POA: Insufficient documentation

## 2022-09-07 DIAGNOSIS — Z79899 Other long term (current) drug therapy: Secondary | ICD-10-CM | POA: Diagnosis not present

## 2022-09-07 DIAGNOSIS — M7712 Lateral epicondylitis, left elbow: Secondary | ICD-10-CM | POA: Diagnosis not present

## 2022-09-07 LAB — CBG MONITORING, ED: Glucose-Capillary: 91 mg/dL (ref 70–99)

## 2022-09-07 MED ORDER — HYDROCODONE-ACETAMINOPHEN 5-325 MG PO TABS
1.0000 | ORAL_TABLET | Freq: Once | ORAL | Status: AC
  Administered 2022-09-07: 1 via ORAL
  Filled 2022-09-07: qty 1

## 2022-09-07 MED ORDER — IBUPROFEN 800 MG PO TABS: 800.0000 mg | ORAL_TABLET | Freq: Three times a day (TID) | ORAL | 0 refills | Status: DC | PRN

## 2022-09-07 NOTE — ED Notes (Signed)
Dc instructions and scripts reviewed with pt no questions or concerns at this time. Will follow up with ortho  ?

## 2022-09-07 NOTE — ED Provider Notes (Signed)
[facility] EMERGENCY DEPARTMENT Provider Note   CSN: [id number] Arrival date & time: 09/07/22  1617     History  Chief Complaint  Patient presents with   Elbow Pain    Jennifer [name] is a 56 y.o. female.  Patient with history of obesity, hypertension, presents with left elbow pain for the past 2 days.  Denies any fall or trauma.  However she does have a scrape to the elbow of uncertain origin.  She has pain with range of motion of the elbow mostly on the lateral side.  No fevers, chills, nausea or vomiting.  No other joint pain other than her chronic knee pain.  No history of gout but does have arthritis in her knees.  No history of IV drug abuse or cancer.  Denies any recent injury to her elbow.  No weakness, numbness or tingling.  She took ibuprofen at home without relief.  The history is provided by the patient.       Home Medications Prior to Admission medications   Medication Sig Start Date End Date Taking? Authorizing Provider  acetaminophen (TYLENOL) 500 MG tablet Frequency:   Dosage:0   MG  Instructions:  Note:1 tab by mouth as needed for pain every 4 hours (Tylenol),take with food. 07/14/13   [provider]  Azelastine-Fluticasone (DYMISTA) 137-50 MCG/ACT SUSP Place 2 sprays into both nostrils 1 day or 1 dose. Patient not taking: Reported on 07/24/2016 06/26/16   Alfonse Spruce, MD  cetirizine (ZYRTEC) 10 MG tablet Take 10 mg by mouth daily.    [provider]  Cholecalciferol (VITAMIN D PO) Take by mouth.    [provider]  EPINEPHrine (AUVI-Q) 0.3 mg/0.3 mL IJ SOAJ injection Use as directed for severe allergic reaction 07/24/16   Alfonse Spruce, MD  famotidine (PEPCID) 20 MG tablet Take 1 tablet (20 mg total) by mouth 2 (two) times daily. 03/05/22   Edwin Dada P, DO  fluticasone (FLONASE) 50 MCG/ACT nasal spray Place 1 spray into both nostrils 2 (two) times daily.    [provider]  hydrochlorothiazide  (HYDRODIURIL) 12.5 MG tablet Take 12.5 mg by mouth. 01/02/16 05/14/17  [provider]  ibuprofen (ADVIL,MOTRIN) 200 MG tablet Take 200 mg by mouth. 04/04/14   [provider]  meclizine (ANTIVERT) 25 MG tablet Take 1 tablet (25 mg total) by mouth 3 (three) times daily as needed for dizziness. 05/14/17   Mesner, Kerria Cower, MD  predniSONE (STERAPRED UNI-PAK 21 TAB) 10 MG (21) TBPK tablet Take by mouth daily. Take 6 tabs by mouth daily  for 1 day, then 5 tabs for 1 day, then 4 tabs for 1 day, then 3 tabs for 1 day, 2 tabs for 1 day, then 1 tab by mouth daily for 1 day 12/01/21   Milagros Loll, MD  ranitidine (ZANTAC) 150 MG tablet Take 150 mg by mouth 2 (two) times daily.    [provider]      Allergies    Latex    Review of Systems   Review of Systems  Constitutional:  Negative for activity change, appetite change and fever.  HENT:  Negative for congestion.   Respiratory:  Negative for cough, chest tightness and shortness of breath.   Cardiovascular:  Negative for chest pain.  Gastrointestinal:  Negative for nausea and vomiting.  Genitourinary:  Negative for dysuria and hematuria.  Musculoskeletal:  Positive for arthralgias, joint swelling and myalgias.  Skin:  Negative for rash.  Neurological:  Negative  for dizziness, weakness and headaches.   all other systems are negative except as noted in the HPI and PMH.    Physical Exam Updated Vital Signs BP (!) 162/89 (BP Location: Right Arm)   Pulse 86   Temp 98 F (36.7 C) (Oral)   Resp 20   LMP 04/07/2017   SpO2 100%  Physical Exam Vitals and nursing note reviewed.  Constitutional:      General: She is not in acute distress.    Appearance: She is well-developed.  HENT:     Head: Normocephalic and atraumatic.     Mouth/Throat:     Pharynx: No oropharyngeal exudate.  Eyes:     Conjunctiva/sclera: Conjunctivae normal.     Pupils: Pupils are equal, round, and reactive to light.  Neck:     Comments: No  meningismus. Cardiovascular:     Rate and Rhythm: Normal rate and regular rhythm.     Heart sounds: Normal heart sounds. No murmur heard. Pulmonary:     Effort: Pulmonary effort is normal. No respiratory distress.     Breath sounds: Normal breath sounds.  Abdominal:     Palpations: Abdomen is soft.     Tenderness: There is no abdominal tenderness. There is no guarding or rebound.  Musculoskeletal:        General: Swelling and tenderness present.     Cervical back: Normal range of motion and neck supple.     Comments: Exam limited by body habitus.  There is tenderness to the lateral left elbow with pain with range of motion.  No significant joint effusion appreciated.  No overlying warmth or erythema.  Pronation and supination are intact.  Intact radial pulse and cardinal hand movements.  Skin:    General: Skin is warm.  Neurological:     Mental Status: She is alert and oriented to person, place, and time.     Cranial Nerves: No cranial nerve deficit.     Motor: No abnormal muscle tone.     Coordination: Coordination normal.     Comments: No ataxia on finger to nose bilaterally. No pronator drift. 5/5 strength throughout. CN 2-12 intact.Equal grip strength. Sensation intact.   Psychiatric:        Behavior: Behavior normal.     ED Results / Procedures / Treatments   Labs (all labs ordered are listed, but only abnormal results are displayed) Labs Reviewed  CBG MONITORING, ED    EKG None  Radiology DG Elbow Complete Left  Result Date: 09/07/2022 CLINICAL DATA:  Shoulder and elbow pain.  No known injury. EXAM: LEFT ELBOW - COMPLETE 3+ VIEW; LEFT SHOULDER - 2+ VIEW COMPARISON:  None Available. FINDINGS: Left shoulder: The mineralization and alignment are normal. There is no evidence of acute fracture or dislocation. Mild glenohumeral and acromioclavicular degenerative changes. The subacromial space appears adequately preserved. No soft tissue abnormalities are identified. Left  elbow: The mineralization and alignment are normal. There is no evidence of acute fracture or dislocation. Mild spurring of the coronoid process. No evidence of elbow joint effusion or focal soft tissue abnormality. IMPRESSION: No acute osseous findings at the left shoulder or elbow. Mild degenerative changes as described. Electronically Signed   By: Carey Bullocks M.D.   On: 09/07/2022 18:10   DG Shoulder Left  Result Date: 09/07/2022 CLINICAL DATA:  Shoulder and elbow pain.  No known injury. EXAM: LEFT ELBOW - COMPLETE 3+ VIEW; LEFT SHOULDER - 2+ VIEW COMPARISON:  None Available. FINDINGS: Left shoulder: The mineralization and  alignment are normal. There is no evidence of acute fracture or dislocation. Mild glenohumeral and acromioclavicular degenerative changes. The subacromial space appears adequately preserved. No soft tissue abnormalities are identified. Left elbow: The mineralization and alignment are normal. There is no evidence of acute fracture or dislocation. Mild spurring of the coronoid process. No evidence of elbow joint effusion or focal soft tissue abnormality. IMPRESSION: No acute osseous findings at the left shoulder or elbow. Mild degenerative changes as described. Electronically Signed   By: Carey Bullocks M.D.   On: 09/07/2022 18:10    Procedures Procedures    Medications Ordered in ED Medications  HYDROcodone-acetaminophen (NORCO/VICODIN) 5-325 MG per tablet 1 tablet (has no administration in time range)    ED Course/ Medical Decision Making/ A&P                           Medical Decision Making Amount and/or Complexity of Data Reviewed Labs: ordered. Decision-making details documented in ED Course. Radiology: ordered and independent interpretation performed. Decision-making details documented in ED Course. ECG/medicine tests: ordered and independent interpretation performed. Decision-making details documented in ED Course.  Risk Prescription drug  management.   Atraumatic left elbow pain.  No significant erythema.  No fever.  No other joint pain other than her chronic knee pain.  X-rays negative for fracture or dislocation.  Results reviewed and interpreted by me.  No evidence of electron bursitis or fluid collection.  Consider lateral epicondylitis versus other musculoskeletal injury. No evidence of septic joint.  We will treat with NSAIDs, sling, orthopedic and PCP follow-up.  Return precautions discussed.        Final Clinical Impression(s) / ED Diagnoses Final diagnoses:  Left elbow pain  Lateral epicondylitis of left elbow    Rx / DC Orders ED Discharge Orders     None         Danice Dippolito, Jeannett Senior, MD 09/07/22 1927

## 2022-09-07 NOTE — ED Triage Notes (Signed)
Patient presents to ED via POV from home. Here with left elbow pain that began around midnight. Tender on palpation. Denies injury or trauma. No slurred speech or facial droop appreciated.

## 2022-09-07 NOTE — Discharge Instructions (Addendum)
X-rays negative for fractures.  Take the anti-inflammatories as prescribed and follow-up with the orthopedic doctor.  Return to the ED with worsening pain, weakness, numbness, tingling, or other concerns.

## 2022-09-07 NOTE — ED Notes (Signed)
Patient transported to X-ray 

## 2023-01-15 ENCOUNTER — Encounter: Payer: Self-pay | Admitting: Nurse Practitioner

## 2023-01-15 ENCOUNTER — Ambulatory Visit (INDEPENDENT_AMBULATORY_CARE_PROVIDER_SITE_OTHER): Payer: BLUE CROSS/BLUE SHIELD | Admitting: Nurse Practitioner

## 2023-01-15 VITALS — BP 112/60 | HR 80 | Temp 97.3°F | Ht 60.0 in | Wt 377.0 lb

## 2023-01-15 DIAGNOSIS — Z23 Encounter for immunization: Secondary | ICD-10-CM | POA: Diagnosis not present

## 2023-01-15 DIAGNOSIS — I10 Essential (primary) hypertension: Secondary | ICD-10-CM | POA: Diagnosis not present

## 2023-01-15 DIAGNOSIS — E7800 Pure hypercholesterolemia, unspecified: Secondary | ICD-10-CM

## 2023-01-15 DIAGNOSIS — K219 Gastro-esophageal reflux disease without esophagitis: Secondary | ICD-10-CM | POA: Diagnosis not present

## 2023-01-15 DIAGNOSIS — J310 Chronic rhinitis: Secondary | ICD-10-CM

## 2023-01-15 DIAGNOSIS — E559 Vitamin D deficiency, unspecified: Secondary | ICD-10-CM

## 2023-01-15 NOTE — Assessment & Plan Note (Signed)
Takes Flonase 1 spray 2 times daily Cetirizine 10 mg daily Continue current medications Avoid allergens

## 2023-01-15 NOTE — Assessment & Plan Note (Signed)
Takes vitamin D 50,000 once weekly Check vitamin D levels

## 2023-01-15 NOTE — Assessment & Plan Note (Signed)
On atorvastatin 10 mg daily Check lipid panel 

## 2023-01-15 NOTE — Patient Instructions (Addendum)
You were given shingles vaccine today.  Nurse please get medical records from [facility]   It is important that you exercise regularly at least 30 minutes 5 times a week as tolerated  Think about what you will eat, plan ahead. Choose " clean, green, fresh or frozen" over canned, processed or packaged foods which are more sugary, salty and fatty. 70 to 75% of food eaten should be vegetables and fruit. Three meals at set times with snacks allowed between meals, but they must be fruit or vegetables. Aim to eat over a 12 hour period , example 7 am to 7 pm, and STOP after  your last meal of the day. Drink water,generally about 64 ounces per day, no other drink is as healthy. Fruit juice is best enjoyed in a healthy way, by EATING the fruit.  Thanks for choosing [name] we consider it a privelige to serve you.

## 2023-01-15 NOTE — Progress Notes (Signed)
New Patient Office Visit  Subjective    Patient ID: Jennifer [name], female    DOB: 1966-07-28  Age: 57 y.o. MRN: [id number]  CC:  Chief Complaint  Patient presents with   Establish Care    Fasting     HPI [name]   has a past medical history of Arthritis, Eczema, GERD (gastroesophageal reflux disease), High cholesterol, Hypertension, Obesity, Seasonal allergies, and Vitamin D deficiency.  Patient presents to establish care for her chronic medical conditions.  Previous PCP.  Dr. Clayborne Dana at [facility] last visit was in December 2023.  She [facility], she is planning  on a doing weight loss surgery. States that  she was told that she needs to lose about 30 pounds before surgery, she plans on starting exercises next week.  Patient is up-to-date with mammogram, colonoscopy, Pap smear, flu vaccine.  Due for shingles vaccine ,need for shingles vaccine discussed with the patient.    Patient denies any adverse reactions to current medications.  Records  were requested from her previous PCP today    Outpatient Encounter Medications as of 01/15/2023  Medication Sig   acetaminophen (TYLENOL) 500 MG tablet Frequency:   Dosage:0   MG  Instructions:  Note:1 tab by mouth as needed for pain every 4 hours (Tylenol),take with food.   atorvastatin (LIPITOR) 10 MG tablet Take 10 mg by mouth daily.   cetirizine (ZYRTEC) 10 MG tablet Take 10 mg by mouth daily.   famotidine (PEPCID) 20 MG tablet Take 1 tablet (20 mg total) by mouth 2 (two) times daily.   fluticasone (FLONASE) 50 MCG/ACT nasal spray Place 1 spray into both nostrils 2 (two) times daily.   hydrochlorothiazide (HYDRODIURIL) 12.5 MG tablet Take 12.5 mg by mouth.   ibuprofen (ADVIL) 800 MG tablet Take 1 tablet (800 mg total) by mouth every 8 (eight) hours as needed for moderate pain.   meclizine (ANTIVERT) 25 MG tablet Take 1 tablet (25 mg total) by mouth 3 (three) times daily as  needed for dizziness.   Vitamin D, Ergocalciferol, (DRISDOL) 1.25 MG (50000 UNIT) CAPS capsule Take 50,000 Units by mouth once a week.   [DISCONTINUED] Azelastine-Fluticasone (DYMISTA) 137-50 MCG/ACT SUSP Place 2 sprays into both nostrils 1 day or 1 dose. (Patient not taking: Reported on 01/15/2023)   [DISCONTINUED] Cholecalciferol (VITAMIN D PO) Take by mouth. (Patient not taking: Reported on 01/15/2023)   [DISCONTINUED] EPINEPHrine (AUVI-Q) 0.3 mg/0.3 mL IJ SOAJ injection Use as directed for severe allergic reaction   [DISCONTINUED] predniSONE (STERAPRED UNI-PAK 21 TAB) 10 MG (21) TBPK tablet Take by mouth daily. Take 6 tabs by mouth daily  for 1 day, then 5 tabs for 1 day, then 4 tabs for 1 day, then 3 tabs for 1 day, 2 tabs for 1 day, then 1 tab by mouth daily for 1 day (Patient not taking: Reported on 01/15/2023)   [DISCONTINUED] ranitidine (ZANTAC) 150 MG tablet Take 150 mg by mouth 2 (two) times daily. (Patient not taking: Reported on 01/15/2023)   Facility-Administered Encounter Medications as of 01/15/2023  Medication   triamcinolone ointment (KENALOG) 0.1 %    Past Medical History:  Diagnosis Date   Arthritis    Eczema    GERD (gastroesophageal reflux disease)    High cholesterol    Hypertension    Obesity    Seasonal allergies    Vitamin D deficiency     Past Surgical History:  Procedure Laterality Date  CESAREAN SECTION     TONSILLECTOMY     TUBAL LIGATION      Family History  Problem Relation Age of Onset   Hepatitis B Mother    High blood pressure Mother    GER disease Father    High blood pressure Father    Allergic rhinitis Sister    Allergic rhinitis Sister    Sinusitis Sister    Colon cancer Daughter    Angioedema Neg Hx    Eczema Neg Hx    Urticaria Neg Hx    Immunodeficiency Neg Hx     Social History   Socioeconomic History   Marital status: Divorced    Spouse name: Not on file   Number of children: 1   Years of education: Not on file   Highest  education level: Not on file  Occupational History   Not on file  Tobacco Use   Smoking status: Never   Smokeless tobacco: Never  Vaping Use   Vaping Use: Never used  Substance and Sexual Activity   Alcohol use: No   Drug use: No   Sexual activity: Yes    Birth control/protection: None, Surgical  Other Topics Concern   Not on file  Social History Narrative   Lives with grandchildren.    Social Determinants of Health   Financial Resource Strain: Not on file  Food Insecurity: Not on file  Transportation Needs: Not on file  Physical Activity: Not on file  Stress: Not on file  Social Connections: Not on file  Intimate Partner Violence: Not on file    Review of Systems  Constitutional: Negative.   Respiratory: Negative.    Cardiovascular: Negative.   Gastrointestinal: Negative.   Genitourinary: Negative.   Musculoskeletal: Negative.   Neurological: Negative.   Psychiatric/Behavioral: Negative.          Objective    BP 112/60   Pulse 80   Temp (!) 97.3 F (36.3 C)   Ht 5' (1.524 m)   Wt (!) 377 lb (171 kg)   LMP 04/07/2017   SpO2 99%   BMI 73.63 kg/m   Physical Exam Constitutional:      General: She is not in acute distress.    Appearance: She is obese. She is not ill-appearing, toxic-appearing or diaphoretic.  Eyes:     General: No scleral icterus.       Right eye: No discharge.        Left eye: No discharge.  Cardiovascular:     Rate and Rhythm: Normal rate and regular rhythm.     Pulses: Normal pulses.     Heart sounds: Normal heart sounds. No murmur heard.    No friction rub. No gallop.  Pulmonary:     Effort: Pulmonary effort is normal. No respiratory distress.     Breath sounds: Normal breath sounds. No stridor. No wheezing, rhonchi or rales.  Chest:     Chest wall: No tenderness.  Abdominal:     General: There is no distension.     Palpations: Abdomen is soft.     Tenderness: There is no abdominal tenderness. There is no guarding.   Musculoskeletal:        General: No deformity or signs of injury.     Right lower leg: No edema.     Left lower leg: No edema.  Skin:    General: Skin is warm and dry.     Coloration: Skin is not jaundiced or pale.     Findings:  No bruising or erythema.  Neurological:     Mental Status: She is alert and oriented to person, place, and time.     Motor: No weakness.     Coordination: Coordination normal.     Gait: Gait normal.  Psychiatric:        Mood and Affect: Mood normal.        Behavior: Behavior normal.        Thought Content: Thought content normal.        Judgment: Judgment normal.         Assessment & Plan:   Problem List Items Addressed This Visit       Cardiovascular and Mediastinum   Hypertension, essential - Primary    BP Readings from Last 3 Encounters:  01/15/23 112/60  09/07/22 (!) 155/88  03/05/22 (!) 115/55  Currently well-controlled on hydrochlorothiazide 12.5 mg daily HTN Controlled . Continue current medications. No changes in management. Discussed DASH diet and dietary sodium restrictions Continue to increase dietary efforts and exercise.         Relevant Medications   atorvastatin (LIPITOR) 10 MG tablet     Respiratory   Chronic rhinitis    Takes Flonase 1 spray 2 times daily Cetirizine 10 mg daily Continue current medications Avoid allergens        Digestive   Esophageal reflux    Takes famotidine 20 mg twice daily Continue current medications Avoid fatty fried foods spicy foods alcohol        Other   Hypercholesteremia    On atorvastatin 10 mg daily Check lipid panel      Relevant Medications   atorvastatin (LIPITOR) 10 MG tablet   Other Relevant Orders   Lipid Panel   Morbid obesity ([place])    Wt Readings from Last 3 Encounters:  01/15/23 (!) 377 lb (171 kg)  03/05/22 (!) 375 lb 1.6 oz (170.1 kg)  12/01/21 (!) 360 lb (163.3 kg)   Body mass index is 73.63 kg/m.  Patient counseled on low-carb modified diet Plans  on starting exercise next week She was encouraged to maintain close follow-up with weight loss management specialist      Vitamin D deficiency    Takes vitamin D 50,000 once weekly Check vitamin D levels      Relevant Orders   Vitamin D, 25-hydroxy   Need for shingles vaccine    Patient educated on CDC recommendation for the  shingles vaccine. Verbal consent was obtained from the patient, vaccine administered by nurse, no sign of adverse reactions noted at this time. Patient education on arm soreness and use of tylenol for this patient  was discussed. Patient educated on the signs and symptoms of adverse effect and advise to contact the office if they occur.       Return in about 3 months (around 04/17/2023) for HTN/HLD/.   Renee Rival, FNP

## 2023-01-15 NOTE — Assessment & Plan Note (Signed)
Wt Readings from Last 3 Encounters:  01/15/23 (!) 377 lb (171 kg)  03/05/22 (!) 375 lb 1.6 oz (170.1 kg)  12/01/21 (!) 360 lb (163.3 kg)   Body mass index is 73.63 kg/m.  Patient counseled on low-carb modified diet Plans on starting exercise next week She was encouraged to maintain close follow-up with weight loss management specialist

## 2023-01-15 NOTE — Assessment & Plan Note (Signed)
Takes famotidine 20 mg twice daily Continue current medications Avoid fatty fried foods spicy foods alcohol

## 2023-01-15 NOTE — Assessment & Plan Note (Signed)
BP Readings from Last 3 Encounters:  01/15/23 112/60  09/07/22 (!) 155/88  03/05/22 (!) 115/55  Currently well-controlled on hydrochlorothiazide 12.5 mg daily HTN Controlled . Continue current medications. No changes in management. Discussed DASH diet and dietary sodium restrictions Continue to increase dietary efforts and exercise.

## 2023-01-15 NOTE — Addendum Note (Signed)
Addended by: Elyse Jarvis on: 01/15/2023 02:22 PM   Modules accepted: Orders

## 2023-01-15 NOTE — Assessment & Plan Note (Signed)
Patient educated on CDC recommendation for theshingles vaccine. Verbal consent was obtained from the patient, vaccine administered by nurse, no sign of adverse reactions noted at this time. Patient education on arm soreness and use of tylenol  for this patient  was discussed. Patient educated on the signs and symptoms of adverse effect and advise to contact the office if they occur.  

## 2023-01-16 LAB — LIPID PANEL
Chol/HDL Ratio: 3.3 ratio (ref 0.0–4.4)
Cholesterol, Total: 172 mg/dL (ref 100–199)
HDL: 52 mg/dL (ref >39)
LDL Chol Calc (NIH): 108 mg/dL — ABNORMAL HIGH (ref 0–99)
Triglycerides: 64 mg/dL (ref 0–149)
VLDL Cholesterol Cal: 12 mg/dL (ref 5–40)

## 2023-01-16 LAB — VITAMIN D 25 HYDROXY (VIT D DEFICIENCY, FRACTURES): Vit D, 25-Hydroxy: 33.2 ng/mL (ref 30.0–100.0)

## 2023-01-16 NOTE — Progress Notes (Signed)
Stable labs continue current medications       The 10-year ASCVD risk score (Arnett DK, et al., 2019) is: 3.3%   Values used to calculate the score:     Age: 57 years     Sex: Female     Is Non-Hispanic African American: Yes     Diabetic: No     Tobacco smoker: No     Systolic Blood Pressure: [id number] mmHg     Is BP treated: Yes     HDL Cholesterol: 52 mg/dL     Total Cholesterol: 172 mg/dL

## 2023-04-19 ENCOUNTER — Encounter: Payer: Self-pay | Admitting: Nurse Practitioner

## 2023-04-19 ENCOUNTER — Ambulatory Visit (INDEPENDENT_AMBULATORY_CARE_PROVIDER_SITE_OTHER): Payer: BLUE CROSS/BLUE SHIELD | Admitting: Nurse Practitioner

## 2023-04-19 VITALS — BP 136/56 | HR 81 | Temp 97.0°F | Ht 60.0 in | Wt 378.6 lb

## 2023-04-19 DIAGNOSIS — K219 Gastro-esophageal reflux disease without esophagitis: Secondary | ICD-10-CM | POA: Diagnosis not present

## 2023-04-19 DIAGNOSIS — I10 Essential (primary) hypertension: Secondary | ICD-10-CM | POA: Diagnosis not present

## 2023-04-19 DIAGNOSIS — E559 Vitamin D deficiency, unspecified: Secondary | ICD-10-CM

## 2023-04-19 DIAGNOSIS — E7800 Pure hypercholesterolemia, unspecified: Secondary | ICD-10-CM

## 2023-04-19 DIAGNOSIS — Z23 Encounter for immunization: Secondary | ICD-10-CM

## 2023-04-19 DIAGNOSIS — M545 Low back pain, unspecified: Secondary | ICD-10-CM

## 2023-04-19 DIAGNOSIS — R600 Localized edema: Secondary | ICD-10-CM

## 2023-04-19 NOTE — Patient Instructions (Signed)
Please  wear compression socks keeping her legs elevated when sitting to help decrease the swelling in your legs   It is important that you exercise regularly at least 30 minutes 5 times a week as tolerated  Think about what you will eat, plan ahead. Choose " clean, green, fresh or frozen" over canned, processed or packaged foods which are more sugary, salty and fatty. 70 to 75% of food eaten should be vegetables and fruit. Three meals at set times with snacks allowed between meals, but they must be fruit or vegetables. Aim to eat over a 12 hour period , example 7 am to 7 pm, and STOP after  your last meal of the day. Drink water,generally about 64 ounces per day, no other drink is as healthy. Fruit juice is best enjoyed in a healthy way, by EATING the fruit.  Thanks for choosing [facility] we consider it a privelige to serve you.

## 2023-04-19 NOTE — Addendum Note (Signed)
Addended byRaj Janus on: 04/19/2023 10:12 AM   Modules accepted: Orders

## 2023-04-19 NOTE — Assessment & Plan Note (Signed)
BP Readings from Last 3 Encounters:  04/19/23 (!) 136/56  01/15/23 112/60  09/07/22 (!) 155/88   HTN Controlled on hydrochlorothiazide 12.5 mg daily Continue current medications. No changes in management. Discussed DASH diet and dietary sodium restrictions Continue to increase dietary efforts and exercise.  BMP today

## 2023-04-19 NOTE — Assessment & Plan Note (Signed)
Patient encouraged to continue to intensify efforts at losing weight Wear compression socks and keep legs elevated to help decrease swelling Takes hydrochlorothiazide 12.5 mg daily

## 2023-04-19 NOTE — Progress Notes (Signed)
Established Patient Office Visit  Subjective:  Patient ID: Jennifer [name], female    DOB: 26-Apr-1966  Age: 57 y.o. MRN: [id number]  CC:  Chief Complaint  Patient presents with   Follow-up    HPI Jennifer [name] is a 57 y.o. female  has a past medical history of Arthritis, Eczema, GERD (gastroesophageal reflux disease), High cholesterol, Hypertension, Obesity, Seasonal allergies, and Vitamin D deficiency.  Patient presents for follow-up for her chronic medical conditions.Patient denies any adverse reactions to her current medications  Has upcoming D&C for abnormal perimenopausal bleeding.  Recent Pap cytology was negative for malignancy.  Hypertension.  On hydrochlorothiazide 12.5 mg daily.  She denies chest pain, dizziness, headaches.  Has nonpitting edema to bilateral lower extremities.  Obesity.  She continues to follow-up with the bariatric surgery.  She is following a diet plan, does some walking exercises.  Has registered for water aerobics.  Plans on eventually having a weight loss surgery.  Taking metformin 500 mg twice daily   Second dose of shingles vaccine given in the office today, on getting the Tdap vaccine at her next appointment.   Past Medical History:  Diagnosis Date   Arthritis    Eczema    GERD (gastroesophageal reflux disease)    High cholesterol    Hypertension    Obesity    Seasonal allergies    Vitamin D deficiency     Past Surgical History:  Procedure Laterality Date   CESAREAN SECTION     TONSILLECTOMY     TUBAL LIGATION      Family History  Problem Relation Age of Onset   Hepatitis B Mother    High blood pressure Mother    GER disease Father    High blood pressure Father    Allergic rhinitis Sister    Allergic rhinitis Sister    Sinusitis Sister    Colon cancer Daughter    Angioedema Neg Hx    Eczema Neg Hx    Urticaria Neg Hx    Immunodeficiency Neg Hx     Social History   Socioeconomic History   Marital status: Divorced     Spouse name: Not on file   Number of children: 1   Years of education: Not on file   Highest education level: Not on file  Occupational History   Not on file  Tobacco Use   Smoking status: Never   Smokeless tobacco: Never  Vaping Use   Vaping Use: Never used  Substance and Sexual Activity   Alcohol use: No   Drug use: No   Sexual activity: Yes    Birth control/protection: None, Surgical  Other Topics Concern   Not on file  Social History Narrative   Lives with grandchildren.    Social Determinants of Health   Financial Resource Strain: Not on file  Food Insecurity: Not on file  Transportation Needs: Not on file  Physical Activity: Not on file  Stress: Not on file  Social Connections: Not on file  Intimate Partner Violence: Not on file    Outpatient Medications Prior to Visit  Medication Sig Dispense Refill   acetaminophen (TYLENOL) 500 MG tablet Frequency:   Dosage:0   MG  Instructions:  Note:1 tab by mouth as needed for pain every 4 hours (Tylenol),take with food.     atorvastatin (LIPITOR) 10 MG tablet Take 10 mg by mouth daily.     cetirizine (ZYRTEC) 10 MG tablet Take 10 mg by mouth daily.     famotidine (  PEPCID) 20 MG tablet Take 1 tablet (20 mg total) by mouth 2 (two) times daily. 30 tablet 0   fluticasone (FLONASE) 50 MCG/ACT nasal spray Place 1 spray into both nostrils 2 (two) times daily.     hydrochlorothiazide (HYDRODIURIL) 12.5 MG tablet Take 12.5 mg by mouth.     ibuprofen (ADVIL) 800 MG tablet Take 1 tablet (800 mg total) by mouth every 8 (eight) hours as needed for moderate pain. 21 tablet 0   meclizine (ANTIVERT) 25 MG tablet Take 1 tablet (25 mg total) by mouth 3 (three) times daily as needed for dizziness. 30 tablet 0   metFORMIN (GLUCOPHAGE) 500 MG tablet Take 500 mg by mouth 2 (two) times daily with a meal.     Vitamin D, Ergocalciferol, (DRISDOL) 1.25 MG (50000 UNIT) CAPS capsule Take 50,000 Units by mouth once a week.     Facility-Administered  Medications Prior to Visit  Medication Dose Route Frequency Provider Last Rate Last Admin   triamcinolone ointment (KENALOG) 0.1 %   Topical BID Alfonse Spruce, MD        Allergies  Allergen Reactions   Latex Other (See Comments) and Hives    Powder causes hives  hives    ROS Review of Systems  Constitutional:  Negative for activity change, appetite change, chills, fatigue and fever.  HENT:  Negative for congestion, dental problem, ear discharge, ear pain, hearing loss, rhinorrhea, sinus pressure, sinus pain, sneezing and sore throat.   Eyes:  Negative for pain, discharge, redness and itching.  Respiratory:  Negative for cough, chest tightness, shortness of breath and wheezing.   Cardiovascular:  Negative for chest pain, palpitations and leg swelling.  Gastrointestinal:  Negative for abdominal distention, abdominal pain, anal bleeding, blood in stool, constipation, diarrhea, nausea, rectal pain and vomiting.  Endocrine: Negative for cold intolerance, heat intolerance, polydipsia, polyphagia and polyuria.  Genitourinary:  Negative for difficulty urinating, dysuria, flank pain, frequency, hematuria, menstrual problem, pelvic pain and vaginal bleeding.  Musculoskeletal:  Positive for back pain. Negative for arthralgias, gait problem, joint swelling and myalgias.  Skin:  Negative for color change, pallor, rash and wound.  Allergic/Immunologic: Negative for environmental allergies, food allergies and immunocompromised state.  Neurological:  Negative for dizziness, tremors, facial asymmetry, weakness and headaches.  Hematological:  Negative for adenopathy. Does not bruise/bleed easily.  Psychiatric/Behavioral:  Negative for agitation, behavioral problems, confusion, decreased concentration, hallucinations, self-injury and suicidal ideas.       Objective:    Physical Exam Vitals and nursing note reviewed.  Constitutional:      General: She is not in acute distress.     Appearance: Normal appearance. She is obese. She is not ill-appearing, toxic-appearing or diaphoretic.  HENT:     Mouth/Throat:     Mouth: Mucous membranes are moist.     Pharynx: Oropharynx is clear. No oropharyngeal exudate or posterior oropharyngeal erythema.  Eyes:     General: No scleral icterus.       Right eye: No discharge.        Left eye: No discharge.     Extraocular Movements: Extraocular movements intact.     Conjunctiva/sclera: Conjunctivae normal.  Cardiovascular:     Rate and Rhythm: Normal rate and regular rhythm.     Pulses: Normal pulses.     Heart sounds: Normal heart sounds. No murmur heard.    No friction rub. No gallop.  Pulmonary:     Effort: Pulmonary effort is normal. No respiratory distress.  Breath sounds: Normal breath sounds. No stridor. No wheezing, rhonchi or rales.  Chest:     Chest wall: No tenderness.  Abdominal:     General: There is no distension.     Palpations: Abdomen is soft.     Tenderness: There is no abdominal tenderness. There is no right CVA tenderness, left CVA tenderness or guarding.  Musculoskeletal:        General: No swelling, tenderness, deformity or signs of injury.     Right lower leg: Edema present.     Left lower leg: Edema present.     Comments: Nonpitting edema bilateral lower extremities  Skin:    General: Skin is warm and dry.     Capillary Refill: Capillary refill takes less than 2 seconds.     Coloration: Skin is not jaundiced or pale.     Findings: No bruising, erythema or lesion.  Neurological:     Mental Status: She is alert and oriented to person, place, and time.     Motor: No weakness.     Coordination: Coordination normal.     Gait: Gait normal.  Psychiatric:        Mood and Affect: Mood normal.        Behavior: Behavior normal.        Thought Content: Thought content normal.        Judgment: Judgment normal.     BP (!) 136/56   Pulse 81   Temp (!) 97 F (36.1 C)   Ht 5' (1.524 m)   Wt (!) 378  lb 9.6 oz (171.7 kg)   LMP 04/07/2017   SpO2 99%   BMI 73.94 kg/m  Wt Readings from Last 3 Encounters:  04/19/23 (!) 378 lb 9.6 oz (171.7 kg)  01/15/23 (!) 377 lb (171 kg)  03/05/22 (!) 375 lb 1.6 oz (170.1 kg)    No results found for: "TSH" Lab Results  Component Value Date   WBC 5.5 03/05/2022   HGB 12.0 03/05/2022   HCT 37.6 03/05/2022   MCV 91.5 03/05/2022   PLT 235 03/05/2022   Lab Results  Component Value Date   NA 136 03/05/2022   K 3.8 03/05/2022   CO2 25 03/05/2022   GLUCOSE 128 (H) 03/05/2022   BUN 16 03/05/2022   CREATININE 0.88 03/05/2022   BILITOT 0.9 12/12/2018   ALKPHOS 78 12/12/2018   AST 18 12/12/2018   ALT 17 12/12/2018   PROT 7.5 12/12/2018   ALBUMIN 3.7 12/12/2018   CALCIUM 8.6 (L) 03/05/2022   ANIONGAP 6 03/05/2022   Lab Results  Component Value Date   CHOL 172 01/15/2023   Lab Results  Component Value Date   HDL 52 01/15/2023   Lab Results  Component Value Date   LDLCALC 108 (H) 01/15/2023   Lab Results  Component Value Date   TRIG 64 01/15/2023   Lab Results  Component Value Date   CHOLHDL 3.3 01/15/2023   No results found for: "HGBA1C"    Assessment & Plan:   Problem List Items Addressed This Visit       Cardiovascular and Mediastinum   Hypertension, essential    BP Readings from Last 3 Encounters:  04/19/23 (!) 136/56  01/15/23 112/60  09/07/22 (!) 155/88   HTN Controlled on hydrochlorothiazide 12.5 mg daily Continue current medications. No changes in management. Discussed DASH diet and dietary sodium restrictions Continue to increase dietary efforts and exercise.  BMP today        Relevant Orders  Basic Metabolic Panel     Digestive   Esophageal reflux    Currently well-controlled on famotidine Continue on famotidine 20 mg twice daily as needed        Other   Hypercholesteremia    Lab Results  Component Value Date   CHOL 172 01/15/2023   HDL 52 01/15/2023   LDLCALC 108 (H) 01/15/2023   TRIG 64  01/15/2023   CHOLHDL 3.3 01/15/2023   Continue atorvastatin 10mg  daily Avoid fatty fried foods      Low back pain    Working on losing weight Takes ibuprofen as needed Moderate Exercise encouraged       Morbid obesity (HCC)    Wt Readings from Last 3 Encounters:  04/19/23 (!) 378 lb 9.6 oz (171.7 kg)  01/15/23 (!) 377 lb (171 kg)  03/05/22 (!) 375 lb 1.6 oz (170.1 kg)   Body mass index is 73.94 kg/m.  Has started metformin 500 mg twice daily to assist with weight loss Patient encouraged to maintain close follow-up with the bariatric specialist Counseled on low-carb modified diet Regular moderate to vigorous exercises at least 150 minutes weekly as tolerated encouraged      Relevant Medications   metFORMIN (GLUCOPHAGE) 500 MG tablet   Vitamin D deficiency    Last vitamin D Lab Results  Component Value Date   VD25OH 33.2 01/15/2023  Continue vitamin D 50,000 units once weekly       Need for shingles vaccine - Primary    Patient educated on CDC recommendation for the shingles vaccine. Verbal consent was obtained from the patient, vaccine administered by nurse, no sign of adverse reactions noted at this time. Patient education on arm soreness and use of tylenol or ibuprofen  for this patient  was discussed. Patient educated on the signs and symptoms of adverse effect and advise to contact the office if they occur.       Bilateral leg edema    Patient encouraged to continue to intensify efforts at losing weight Wear compression socks and keep legs elevated to help decrease swelling Takes hydrochlorothiazide 12.5 mg daily       No orders of the defined types were placed in this encounter.   Follow-up: Return in about 6 months (around 10/20/2023) for HTN, VITAMIN D.    Donell Beers, FNP

## 2023-04-19 NOTE — Assessment & Plan Note (Signed)
Lab Results  Component Value Date   CHOL 172 01/15/2023   HDL 52 01/15/2023   LDLCALC 108 (H) 01/15/2023   TRIG 64 01/15/2023   CHOLHDL 3.3 01/15/2023   Continue atorvastatin 10mg  daily Avoid fatty fried foods

## 2023-04-19 NOTE — Assessment & Plan Note (Signed)
Working on losing weight Takes ibuprofen as needed Moderate Exercise encouraged

## 2023-04-19 NOTE — Assessment & Plan Note (Signed)
Last vitamin D Lab Results  Component Value Date   VD25OH 33.2 01/15/2023  Continue vitamin D 50,000 units once weekly

## 2023-04-19 NOTE — Assessment & Plan Note (Signed)
Currently well-controlled on famotidine Continue on famotidine 20 mg twice daily as needed

## 2023-04-19 NOTE — Assessment & Plan Note (Signed)
Wt Readings from Last 3 Encounters:  04/19/23 (!) 378 lb 9.6 oz (171.7 kg)  01/15/23 (!) 377 lb (171 kg)  03/05/22 (!) 375 lb 1.6 oz (170.1 kg)   Body mass index is 73.94 kg/m.  Has started metformin 500 mg twice daily to assist with weight loss Patient encouraged to maintain close follow-up with the bariatric specialist Counseled on low-carb modified diet Regular moderate to vigorous exercises at least 150 minutes weekly as tolerated encouraged

## 2023-04-19 NOTE — Assessment & Plan Note (Signed)
Patient educated on CDC recommendation for the shingles vaccine. Verbal consent was obtained from the patient, vaccine administered by nurse, no sign of adverse reactions noted at this time. Patient education on arm soreness and use of tylenol or ibuprofen  for this patient  was discussed. Patient educated on the signs and symptoms of adverse effect and advise to contact the office if they occur.  

## 2023-04-20 LAB — BASIC METABOLIC PANEL
BUN/Creatinine Ratio: 15 (ref 9–23)
BUN: 11 mg/dL (ref 6–24)
CO2: 25 mmol/L (ref 20–29)
Calcium: 9.1 mg/dL (ref 8.7–10.2)
Chloride: 106 mmol/L (ref 96–106)
Creatinine, Ser: 0.74 mg/dL (ref 0.57–1.00)
Glucose: 101 mg/dL — ABNORMAL HIGH (ref 70–99)
Potassium: 4.3 mmol/L (ref 3.5–5.2)
Sodium: 142 mmol/L (ref 134–144)
eGFR: 94 mL/min/{1.73_m2} (ref >59)

## 2023-07-29 IMAGING — DX DG CHEST 1V PORT
1 series · 1 of 1 positions shown · non-contrast
Comparison: 12/12/2018

CLINICAL DATA: Chest pain and dizziness

EXAM:
PORTABLE CHEST 1 VIEW

[chest ap]
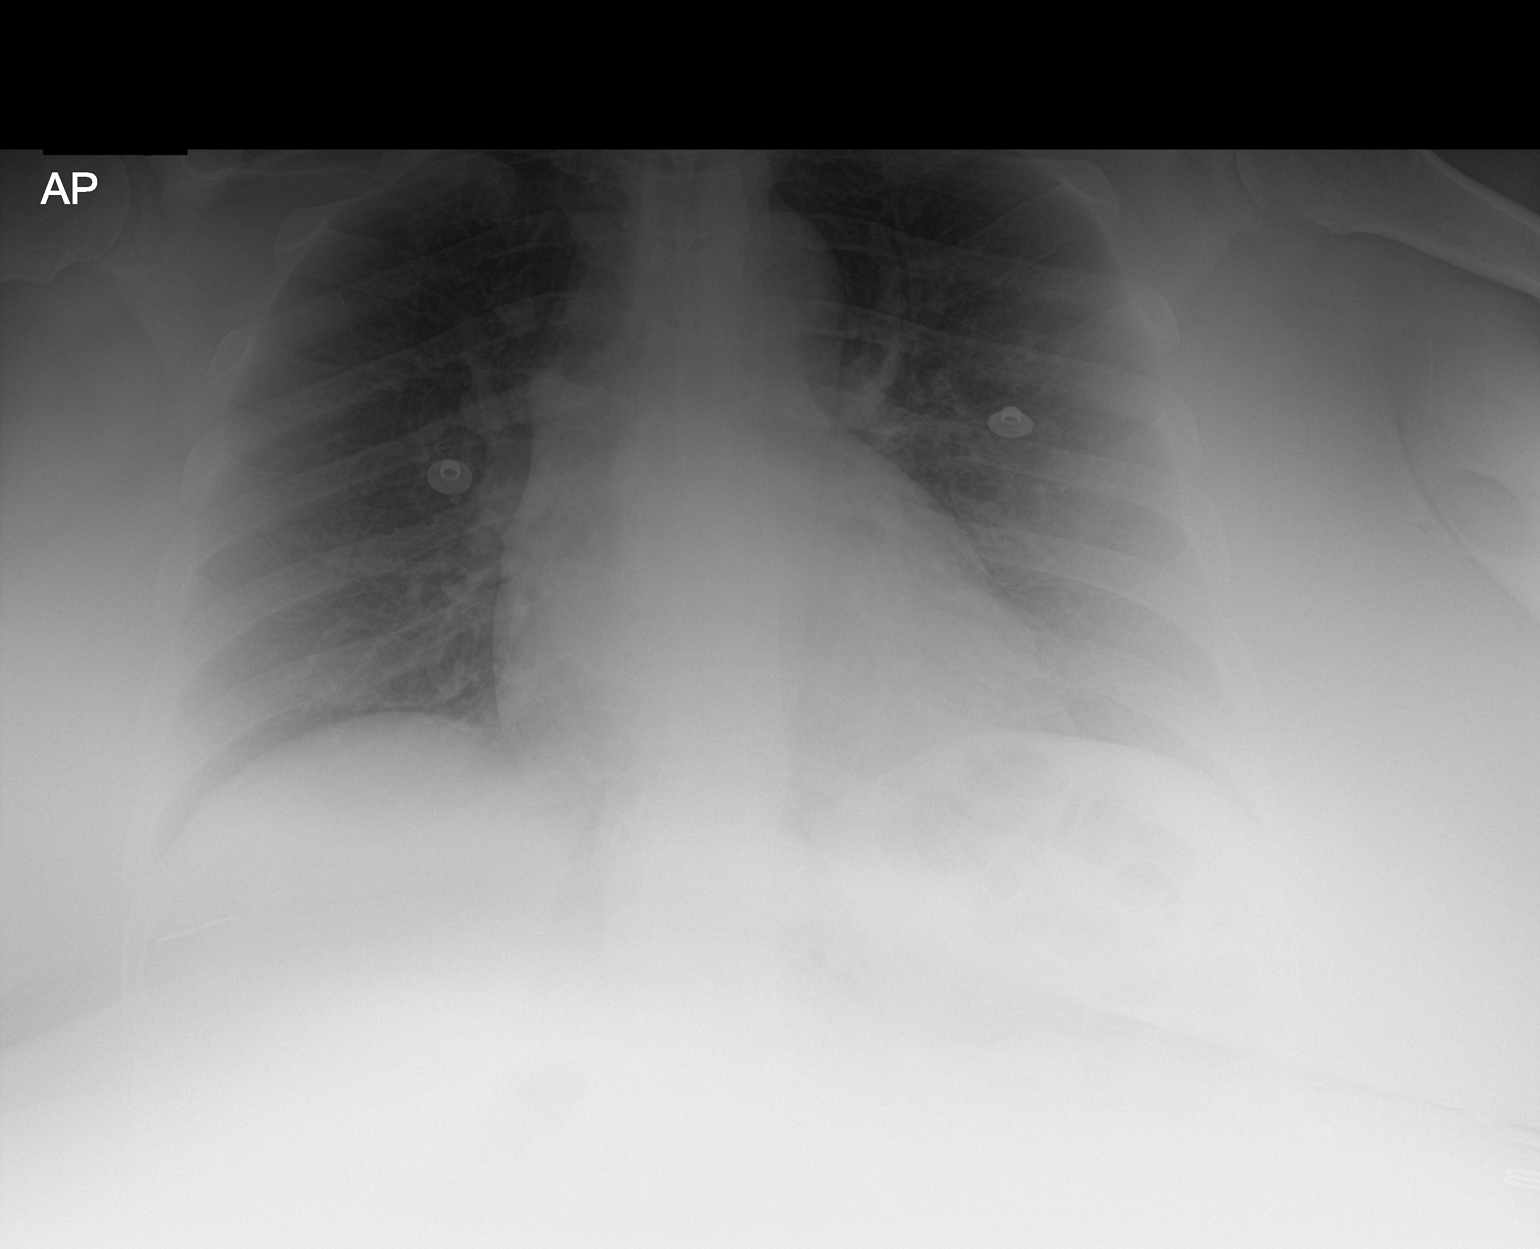

[1 of 1 positions shown; findings below may reference images not displayed]

FINDINGS: Mildly degraded exam due to AP portable technique and patient body
habitus. Apical lordotic positioning. Midline trachea. Normal heart
size for level of inspiration. No pleural effusion or pneumothorax.
Clear lungs.
IMPRESSION: No acute cardiopulmonary disease.

## 2023-10-22 ENCOUNTER — Ambulatory Visit: Payer: Self-pay | Admitting: Nurse Practitioner

## 2023-11-04 IMAGING — DX DG CHEST 1V PORT
1 series · 1 of 1 positions shown · non-contrast
Comparison: 08/25/2021

CLINICAL DATA: Shortness of breath

EXAM:
PORTABLE CHEST 1 VIEW

[chest ap]
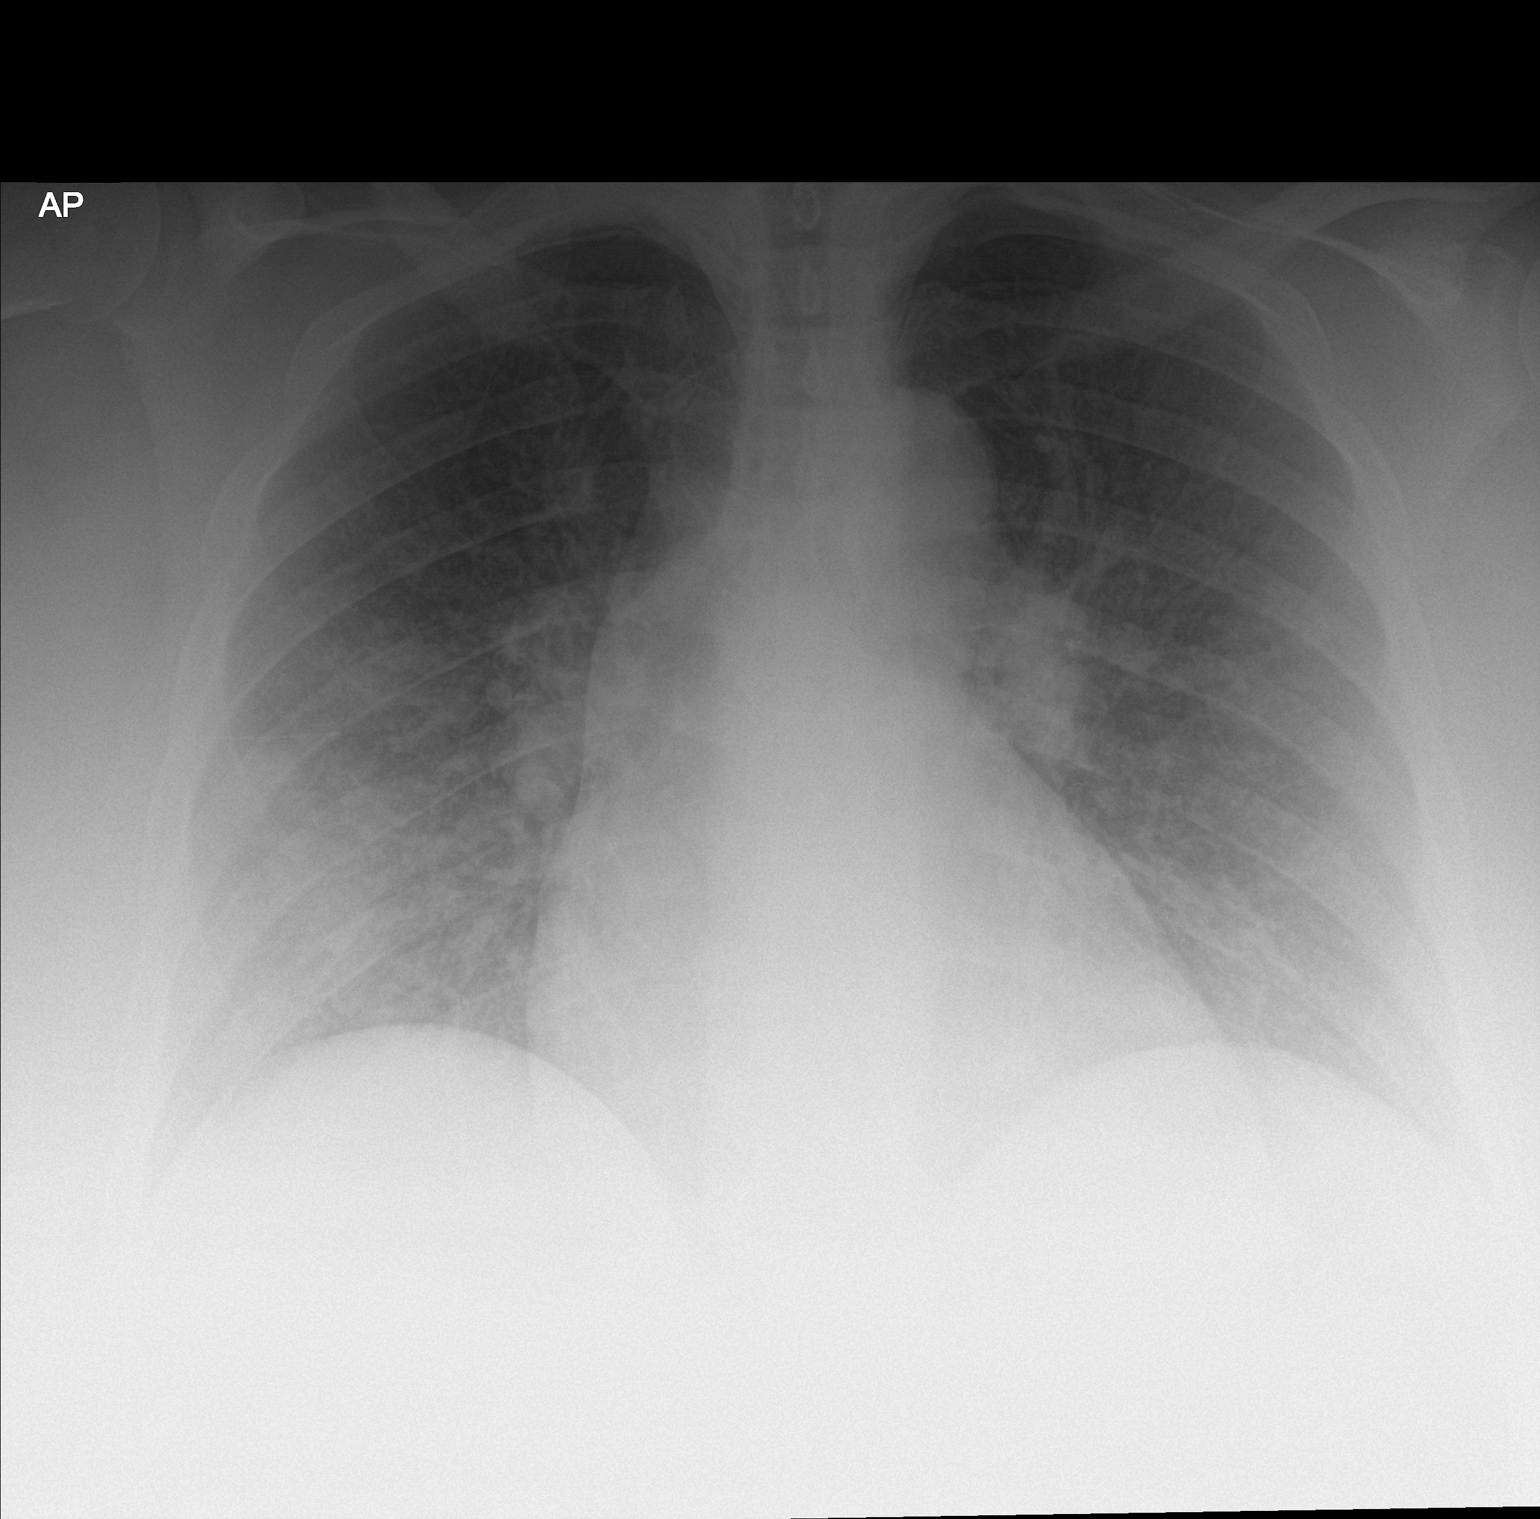

[1 of 1 positions shown; findings below may reference images not displayed]

FINDINGS: Underpenetrated AP portable examination. Mild cardiomegaly. Diffuse
bilateral interstitial pulmonary opacity. The visualized skeletal
structures are unremarkable.
IMPRESSION: Underpenetrated AP portable examination. Mild cardiomegaly with
diffuse bilateral interstitial pulmonary opacity, likely edema. No
focal airspace opacity.

## 2023-11-12 ENCOUNTER — Ambulatory Visit: Payer: Self-pay | Admitting: Nurse Practitioner

## 2023-12-05 ENCOUNTER — Emergency Department (HOSPITAL_BASED_OUTPATIENT_CLINIC_OR_DEPARTMENT_OTHER): Payer: No Typology Code available for payment source

## 2023-12-05 ENCOUNTER — Other Ambulatory Visit: Payer: Self-pay

## 2023-12-05 ENCOUNTER — Encounter (HOSPITAL_BASED_OUTPATIENT_CLINIC_OR_DEPARTMENT_OTHER): Payer: Self-pay

## 2023-12-05 ENCOUNTER — Emergency Department (HOSPITAL_BASED_OUTPATIENT_CLINIC_OR_DEPARTMENT_OTHER)
Admission: EM | Admit: 2023-12-05 | Discharge: 2023-12-06 | Disposition: A | Discharge status: Home / Self Care | Payer: No Typology Code available for payment source | Attending: Emergency Medicine | Admitting: Emergency Medicine

## 2023-12-05 DIAGNOSIS — Z6841 Body Mass Index (BMI) 40.0 and over, adult: Secondary | ICD-10-CM | POA: Diagnosis not present

## 2023-12-05 DIAGNOSIS — Z79899 Other long term (current) drug therapy: Secondary | ICD-10-CM | POA: Diagnosis not present

## 2023-12-05 DIAGNOSIS — Z7984 Long term (current) use of oral hypoglycemic drugs: Secondary | ICD-10-CM | POA: Insufficient documentation

## 2023-12-05 DIAGNOSIS — I10 Essential (primary) hypertension: Secondary | ICD-10-CM | POA: Insufficient documentation

## 2023-12-05 DIAGNOSIS — R0602 Shortness of breath: Secondary | ICD-10-CM | POA: Diagnosis present

## 2023-12-05 DIAGNOSIS — E669 Obesity, unspecified: Secondary | ICD-10-CM | POA: Diagnosis not present

## 2023-12-05 DIAGNOSIS — J40 Bronchitis, not specified as acute or chronic: Secondary | ICD-10-CM | POA: Diagnosis not present

## 2023-12-05 DIAGNOSIS — R062 Wheezing: Secondary | ICD-10-CM

## 2023-12-05 LAB — COMPREHENSIVE METABOLIC PANEL
ALT: 16 U/L (ref 0–44)
AST: 18 U/L (ref 15–41)
Albumin: 3.1 g/dL — ABNORMAL LOW (ref 3.5–5.0)
Alkaline Phosphatase: 75 U/L (ref 38–126)
Anion gap: 7 (ref 5–15)
BUN: 9 mg/dL (ref 6–20)
CO2: 25 mmol/L (ref 22–32)
Calcium: 8.6 mg/dL — ABNORMAL LOW (ref 8.9–10.3)
Chloride: 105 mmol/L (ref 98–111)
Creatinine, Ser: 0.75 mg/dL (ref 0.44–1.00)
GFR, Estimated: >60 mL/min (ref >60)
Glucose, Bld: 100 mg/dL — ABNORMAL HIGH (ref 70–99)
Potassium: 4 mmol/L (ref 3.5–5.1)
Sodium: 137 mmol/L (ref 135–145)
Total Bilirubin: 0.9 mg/dL (ref 0.0–1.2)
Total Protein: 7.2 g/dL (ref 6.5–8.1)

## 2023-12-05 LAB — CBC WITH DIFFERENTIAL/PLATELET
Abs Immature Granulocytes: 0.02 10*3/uL (ref 0.00–0.07)
Basophils Absolute: 0 10*3/uL (ref 0.0–0.1)
Basophils Relative: 0 %
Eosinophils Absolute: 0.1 10*3/uL (ref 0.0–0.5)
Eosinophils Relative: 2 %
HCT: 34.5 % — ABNORMAL LOW (ref 36.0–46.0)
Hemoglobin: 11.1 g/dL — ABNORMAL LOW (ref 12.0–15.0)
Immature Granulocytes: 0 %
Lymphocytes Relative: 36 %
Lymphs Abs: 2.7 10*3/uL (ref 0.7–4.0)
MCH: 29.8 pg (ref 26.0–34.0)
MCHC: 32.2 g/dL (ref 30.0–36.0)
MCV: 92.5 fL (ref 80.0–100.0)
Monocytes Absolute: 0.6 10*3/uL (ref 0.1–1.0)
Monocytes Relative: 7 %
Neutro Abs: 4.1 10*3/uL (ref 1.7–7.7)
Neutrophils Relative %: 55 %
Platelets: 248 10*3/uL (ref 150–400)
RBC: 3.73 MIL/uL — ABNORMAL LOW (ref 3.87–5.11)
RDW: 14.1 % (ref 11.5–15.5)
WBC: 7.5 10*3/uL (ref 4.0–10.5)
nRBC: 0 % (ref 0.0–0.2)

## 2023-12-05 LAB — RESP PANEL BY RT-PCR (RSV, FLU A&B, COVID)  RVPGX2
Influenza A by PCR: NEGATIVE
Influenza B by PCR: NEGATIVE
Resp Syncytial Virus by PCR: NEGATIVE
SARS Coronavirus 2 by RT PCR: NEGATIVE

## 2023-12-05 LAB — TROPONIN I (HIGH SENSITIVITY): Troponin I (High Sensitivity): 6 ng/L (ref <18)

## 2023-12-05 LAB — BRAIN NATRIURETIC PEPTIDE: B Natriuretic Peptide: 42.9 pg/mL (ref 0.0–100.0)

## 2023-12-05 MED ORDER — ALBUTEROL SULFATE (5 MG/ML) 0.5% IN NEBU: 2.5000 mg | INHALATION_SOLUTION | Freq: Four times a day (QID) | RESPIRATORY_TRACT | 12 refills | Status: AC | PRN

## 2023-12-05 MED ORDER — ALBUTEROL SULFATE HFA 108 (90 BASE) MCG/ACT IN AERS: 2.0000 | INHALATION_SPRAY | RESPIRATORY_TRACT | Status: DC | PRN

## 2023-12-05 MED ORDER — IPRATROPIUM-ALBUTEROL 0.5-2.5 (3) MG/3ML IN SOLN
3.0000 mL | RESPIRATORY_TRACT | Status: AC
  Administered 2023-12-05 (×3): 3 mL via RESPIRATORY_TRACT
  Filled 2023-12-05: qty 9

## 2023-12-05 MED ORDER — GUAIFENESIN-CODEINE 100-10 MG/5ML PO SOLN: 5.0000 mL | Freq: Three times a day (TID) | ORAL | 0 refills | Status: DC | PRN

## 2023-12-05 MED ORDER — METHYLPREDNISOLONE 4 MG PO TBPK: ORAL_TABLET | ORAL | 0 refills | Status: DC

## 2023-12-05 NOTE — Discharge Instructions (Signed)
As we discussed, your workup in the ER today was reassuring for acute findings.  X-ray imaging of your chest did show signs of bronchitis.  This is likely left over from the respiratory infection you had a few weeks ago.  I have given you a prescription for steroids for you to take as prescribed in its entirety and codeine cough syrup for you to take as prescribed as needed.  Do not drive or operate heavy machinery while taking the codeine as it can be sedating.  I have also given you a prescription for a nebulizer machine and albuterol for you to use as prescribed as needed.  Please follow-up closely with your primary care doctor.  I also recommend:  Increased fluid intake. Sports drinks offer valuable electrolytes, sugars, and fluids.  Breathing heated mist or steam (vaporizer or shower).  Eating chicken soup or other clear broths, and maintaining good nutrition.   Increasing usage of your inhaler if you have asthma.  Return to work when your temperature has returned to normal.  Gargle warm salt water and spit it out for sore throat. Take benadryl or Zyrtec to decrease sinus secretions.  Follow Up: Follow up with your primary care doctor in 5-7 days for recheck of ongoing symptoms.  Return to emergency department for emergent changing or worsening of symptoms.

## 2023-12-05 NOTE — Progress Notes (Signed)
Ambulated PT on RA, PT Sats dropped to low 90s while walking.

## 2023-12-05 NOTE — ED Triage Notes (Signed)
Pt reports chest pain, coughing, shortness of breath, and having to sleep while sitting up. Pt states that she got diagnosed with pneumonia 02/04.

## 2023-12-05 NOTE — ED Provider Notes (Addendum)
[facility] Provider Note   CSN: [id number] Arrival date & time: 12/05/23  1619     History  Chief Complaint  Patient presents with   Shortness of Breath    Jennifer [name] is a 58 y.o. female.  Patient with history of morbid obesity, hypertension, GERD, hyperlipidemia presents today with complaints of persistent cough.  States that she has been having symptoms for greater than 2 weeks now.  She notes that she went to urgent care on 2/4 and had a chest x-ray and was diagnosed with pneumonia.  She was prescribed Augmentin and azithromycin as well as prednisone and an albuterol inhaler which she took as prescribed in their entirety for management of her symptoms.  She states that while she was on the medication her symptoms seem to improve, however when she ran out she began coughing again.  She states that her cough is worse at night and she is having to sleep more elevated due to shortness of breath from coughing. She denies any fevers or chills. Does note some tightness in her chest from coughing but no significant pain. Does also note that she feels like her legs are more swollen than normal.  She notes they do swell to an extent at baseline but appear to be swollen more than normal in both legs.  Denies any pain in her legs.  No recent travel or surgeries.  No history of blood clots or malignancy.  The history is provided by the patient. No language interpreter was used.  Shortness of Breath Associated symptoms: cough        Home Medications Prior to Admission medications   Medication Sig Start Date End Date Taking? Authorizing Provider  acetaminophen (TYLENOL) 500 MG tablet Frequency:   Dosage:0   MG  Instructions:  Note:1 tab by mouth as needed for pain every 4 hours (Tylenol),take with food. 07/14/13   [provider]  atorvastatin (LIPITOR) 10 MG tablet Take 10 mg by mouth daily. 12/02/22   [provider]  cetirizine  (ZYRTEC) 10 MG tablet Take 10 mg by mouth daily.    [provider]  famotidine (PEPCID) 20 MG tablet Take 1 tablet (20 mg total) by mouth 2 (two) times daily. 03/05/22   Edwin Dada P, DO  fluticasone (FLONASE) 50 MCG/ACT nasal spray Place 1 spray into both nostrils 2 (two) times daily.    [provider]  hydrochlorothiazide (HYDRODIURIL) 12.5 MG tablet Take 12.5 mg by mouth. 01/02/16 04/19/23  [provider]  ibuprofen (ADVIL) 800 MG tablet Take 1 tablet (800 mg total) by mouth every 8 (eight) hours as needed for moderate pain. 09/07/22   Rancour, Jeannett Senior, MD  meclizine (ANTIVERT) 25 MG tablet Take 1 tablet (25 mg total) by mouth 3 (three) times daily as needed for dizziness. 05/14/17   Mesner, Yiselle Cower, MD  metFORMIN (GLUCOPHAGE) 500 MG tablet Take 500 mg by mouth 2 (two) times daily with a meal. 03/23/23 03/22/24  [provider]  Vitamin D, Ergocalciferol, (DRISDOL) 1.25 MG (50000 UNIT) CAPS capsule Take 50,000 Units by mouth once a week.    [provider]      Allergies    Latex    Review of Systems   Review of Systems  Respiratory:  Positive for cough and shortness of breath.   All other systems reviewed and are negative.   Physical Exam Updated Vital Signs BP (!) 174/79 (BP Location: Right Arm)   Pulse 97  Temp 98.5 F (36.9 C) (Oral)   Resp 19   Ht 5' (1.524 m)   Wt (!) 172.4 kg   LMP 04/07/2017   SpO2 100%   BMI 74.21 kg/m  Physical Exam Vitals and nursing note reviewed.  Constitutional:      General: She is not in acute distress.    Appearance: Normal appearance. She is obese. She is not ill-appearing, toxic-appearing or diaphoretic.  HENT:     Head: Normocephalic and atraumatic.  Cardiovascular:     Rate and Rhythm: Normal rate.     Pulses:          Dorsalis pedis pulses are 2+ on the right side and 2+ on the left side.       Posterior tibial pulses are 2+ on the right side and 2+ on the left side.  Pulmonary:     Effort:  Pulmonary effort is normal. No respiratory distress.     Breath sounds: Wheezing present.  Abdominal:     Palpations: Abdomen is soft.     Tenderness: There is no abdominal tenderness.  Musculoskeletal:        General: Normal range of motion.     Cervical back: Normal range of motion.     Right lower leg: No tenderness. Edema present.     Left lower leg: No tenderness. Edema present.  Skin:    General: Skin is warm and dry.  Neurological:     General: No focal deficit present.     Mental Status: She is alert.  Psychiatric:        Mood and Affect: Mood normal.        Behavior: Behavior normal.     ED Results / Procedures / Treatments   Labs (all labs ordered are listed, but only abnormal results are displayed) Labs Reviewed  CBC WITH DIFFERENTIAL/PLATELET - Abnormal; Notable for the following components:      Result Value   RBC 3.73 (*)    Hemoglobin 11.1 (*)    HCT 34.5 (*)    All other components within normal limits  COMPREHENSIVE METABOLIC PANEL - Abnormal; Notable for the following components:   Glucose, Bld 100 (*)    Calcium 8.6 (*)    Albumin 3.1 (*)    All other components within normal limits  RESP PANEL BY RT-PCR (RSV, FLU A&B, COVID)  RVPGX2  BRAIN NATRIURETIC PEPTIDE  TROPONIN I (HIGH SENSITIVITY)    EKG None  Radiology DG Chest 2 View Result Date: 12/05/2023 CLINICAL DATA:  shortness of breath EXAM: CHEST - 2 VIEW COMPARISON:  Mar 05, 2022, February twenty-fourth 2020 FINDINGS: The cardiomediastinal silhouette is unchanged in contour. No pleural effusion. No pneumothorax. Diffuse bronchitic marking with peribronchial cuffing. No distinct focal consolidation. Visualized abdomen is unremarkable. Multilevel degenerative changes of the thoracic spine. IMPRESSION: Findings suggestive of small airways disease/bronchitis. No focal consolidation. Electronically Signed   By: Meda Klinefelter M.D.   On: 12/05/2023 17:27    Procedures Procedures    Medications  Ordered in ED Medications  ipratropium-albuterol (DUONEB) 0.5-2.5 (3) MG/3ML nebulizer solution 3 mL (3 mLs Nebulization Given 12/05/23 2118)    ED Course/ Medical Decision Making/ A&P                                 Medical Decision Making Amount and/or Complexity of Data Reviewed Labs: ordered. Radiology: ordered.  Risk OTC drugs. Prescription drug management.  This patient is a 58 y.o. female who presents to the ED for concern of shortness of breath, this involves an extensive number of treatment options, and is a complaint that carries with it a high risk of complications and morbidity. The emergent differential diagnosis prior to evaluation includes, but is not limited to, URI, obesity hypoventilation syndrome, CHF, pericardial effusion/tamponade, arrhythmias, ACS, COPD, asthma, bronchitis, pneumonia, pneumothorax, PE, anemia   This is not an exhaustive differential.   Past Medical History / Co-morbidities / Social History:  has a past medical history of Arthritis, Eczema, GERD (gastroesophageal reflux disease), High cholesterol, Hypertension, Obesity, Seasonal allergies, and Vitamin D deficiency.  Additional history: Chart reviewed. Pertinent results include: Unable to review notes from urgent care visit on 2/4  Physical Exam: Physical exam performed. The pertinent findings include: speaking in complete sentences, mild expiratory wheezing  Lab Tests: I ordered, and personally interpreted labs.  The pertinent results include:  hgb 11.1 consistent with previous, RVP negative, BNP WNL. Troponin WNL.   Imaging Studies: I ordered imaging studies including DG chest. I independently visualized and interpreted imaging which showed   Findings suggestive of small airways disease/bronchitis. No focal consolidation.   I agree with the radiologist interpretation.   Cardiac Monitoring:  The patient was maintained on a cardiac monitor.  Cardiac monitor showed an underlying rhythm  of: sinus tachycardia, no STEMI. I agree with this interpretation.   Medications: I ordered medication including duoneb  for wheezing. Reevaluation of the patient after these medicines showed that the patient improved. I have reviewed the patients home medicines and have made adjustments as needed.  Disposition: After consideration of the diagnostic results and the patients response to treatment, I feel that emergency department workup does not suggest an emergent condition requiring admission or immediate intervention beyond what has been performed at this time. The plan is: Discharge with close outpatient follow-up and return precautions.  Patient's workup is benign, after nebulizer treatment she is feeling substantially improved.  She is able to walk around the department without any significant drop in oxygen saturation or significant shortness of breath. She wants to go home. I did consider further evaluation with d dimer/PE, however clinically feel this to be less likely. Discussed same with patient who is understanding and in agreement. She states that she really feels overall well until she has her coughing episodes. Likely due to postviral cough syndrome/bronchitis. She has not signs of persistent pneumonia. Will treat with codeine cough syrup.  PDMP reviewed.  Patient advised not to drive or operate heavy machinery while taking this medication.  Will also give another course of steroids as she felt like she was doing better when she was on these.  I have also written her for a nebulizer machine and albuterol pods as she feels like the nebulizer treatments she was given here today helped her symptoms substantially more than the albuterol inhaler that she has been using at home.  She has a PCP appointment next week, emphasized importance of following up with them on how she is doing at this appointment. Evaluation and diagnostic testing in the emergency department does not suggest an emergent condition  requiring admission or immediate intervention beyond what has been performed at this time.  Plan for discharge with close PCP follow-up.  Patient is understanding and amenable with plan, educated on red flag symptoms that would prompt immediate return.  Patient discharged in stable condition.  Final Clinical Impression(s) / ED Diagnoses Final diagnoses:  Bronchitis  Wheezing  Rx / DC Orders ED Discharge Orders          Ordered    methylPREDNISolone (MEDROL DOSEPAK) 4 MG TBPK tablet        12/05/23 2235    For home use only DME Nebulizer machine        12/05/23 2235    albuterol (PROVENTIL) (5 MG/ML) 0.5% nebulizer solution  Every 6 hours PRN        12/05/23 2235    guaiFENesin-codeine 100-10 MG/5ML syrup  3 times daily PRN        12/05/23 2235          An After Visit Summary was printed and given to the patient.     Silva Bandy, PA-C 12/05/23 2250    Silva Bandy, PA-C 12/05/23 2251    Glyn Ade, MD 12/05/23 2329

## 2023-12-08 ENCOUNTER — Telehealth (INDEPENDENT_AMBULATORY_CARE_PROVIDER_SITE_OTHER): Payer: Self-pay | Admitting: Nurse Practitioner

## 2023-12-08 VITALS — BP 148/85 | HR 74 | Wt 380.0 lb

## 2023-12-08 DIAGNOSIS — Z09 Encounter for follow-up examination after completed treatment for conditions other than malignant neoplasm: Secondary | ICD-10-CM | POA: Insufficient documentation

## 2023-12-08 DIAGNOSIS — I10 Essential (primary) hypertension: Secondary | ICD-10-CM | POA: Diagnosis not present

## 2023-12-08 DIAGNOSIS — R600 Localized edema: Secondary | ICD-10-CM

## 2023-12-08 DIAGNOSIS — J40 Bronchitis, not specified as acute or chronic: Secondary | ICD-10-CM | POA: Diagnosis not present

## 2023-12-08 DIAGNOSIS — J310 Chronic rhinitis: Secondary | ICD-10-CM

## 2023-12-08 DIAGNOSIS — E7800 Pure hypercholesterolemia, unspecified: Secondary | ICD-10-CM | POA: Diagnosis not present

## 2023-12-08 DIAGNOSIS — E559 Vitamin D deficiency, unspecified: Secondary | ICD-10-CM

## 2023-12-08 MED ORDER — HYDROCHLOROTHIAZIDE 12.5 MG PO TABS: 12.5000 mg | ORAL_TABLET | Freq: Every day | ORAL | 1 refills | Status: DC

## 2023-12-08 MED ORDER — FLUTICASONE PROPIONATE 50 MCG/ACT NA SUSP: 1.0000 | Freq: Two times a day (BID) | NASAL | 3 refills | Status: AC

## 2023-12-08 MED ORDER — METFORMIN HCL 500 MG PO TABS: 500.0000 mg | ORAL_TABLET | Freq: Two times a day (BID) | ORAL | 2 refills | Status: AC

## 2023-12-08 MED ORDER — GUAIFENESIN-CODEINE 100-10 MG/5ML PO SOLN: 5.0000 mL | Freq: Three times a day (TID) | ORAL | 0 refills | Status: DC | PRN

## 2023-12-08 MED ORDER — ATORVASTATIN CALCIUM 10 MG PO TABS: 10.0000 mg | ORAL_TABLET | Freq: Every day | ORAL | 1 refills | Status: DC

## 2023-12-08 NOTE — Assessment & Plan Note (Signed)
Working on losing weight Encouraged to wear compression socks keep legs elevated to help decrease swelling, DASH diet advised Hydrochlorothiazide 12.5 mg daily refilled

## 2023-12-08 NOTE — Assessment & Plan Note (Addendum)
Continue Flonase nasal spray 1 spray twice daily, cetirizine 10 mg daily Avoid allergens Patient referred to allergist

## 2023-12-08 NOTE — Patient Instructions (Signed)
For the swelling in your lower extremities, be sure to elevate your legs when able, mind the salt intake, stay physically active and consider wearing compression stockings.    For vitamin D deficiency.  Please take vitamin D 1000 units daily, please get this medication over-the-counter   1. Morbid obesity (HCC) (Primary)  - metFORMIN (GLUCOPHAGE) 500 MG tablet; Take 1 tablet (500 mg total) by mouth 2 (two) times daily with a meal.  Dispense: 180 tablet; Refill: 2  2. Hypertension, essential  - hydrochlorothiazide (HYDRODIURIL) 12.5 MG tablet; Take 1 tablet (12.5 mg total) by mouth daily.  Dispense: 90 tablet; Refill: 1  3. Hypercholesteremia  - atorvastatin (LIPITOR) 10 MG tablet; Take 1 tablet (10 mg total) by mouth daily.  Dispense: 90 tablet; Refill: 1  4. Bilateral leg edema  - hydrochlorothiazide (HYDRODIURIL) 12.5 MG tablet; Take 1 tablet (12.5 mg total) by mouth daily.  Dispense: 90 tablet; Refill: 1  5. Bronchitis  - guaiFENesin-codeine 100-10 MG/5ML syrup; Take 5 mLs by mouth 3 (three) times daily as needed for cough.  Dispense: 120 mL; Refill: 0 - Ambulatory referral to Pulmonology  6. Vitamin D deficiency   It is important that you exercise regularly at least 30 minutes 5 times a week as tolerated  Think about what you will eat, plan ahead. Choose " clean, green, fresh or frozen" over canned, processed or packaged foods which are more sugary, salty and fatty. 70 to 75% of food eaten should be vegetables and fruit. Three meals at set times with snacks allowed between meals, but they must be fruit or vegetables. Aim to eat over a 12 hour period , example 7 am to 7 pm, and STOP after  your last meal of the day. Drink water,generally about 64 ounces per day, no other drink is as healthy. Fruit juice is best enjoyed in a healthy way, by EATING the fruit.  Thanks for choosing [facility] we consider it a privelige to serve you.

## 2023-12-08 NOTE — Assessment & Plan Note (Signed)
BP Readings from Last 3 Encounters:  12/08/23 (!) 148/85  12/05/23 (!) 174/79  04/19/23 (!) 136/56  Hydrochlorothiazide 12.5 mg daily refilled Continue current medications. No changes in management. Discussed DASH diet and dietary sodium restrictions Continue to increase dietary efforts and exercise.  Follow-up in the office in 4 months

## 2023-12-08 NOTE — Progress Notes (Addendum)
 Virtual Visit via Video Note  I connected with Jennifer [name] @ on 12/08/23 at  8:00 AM EST by video and verified that I am speaking with the correct person using two identifiers.  I spent 15 minutes on this video encounter  Location: Patient: home Provider: office   I discussed the limitations, risks, security and privacy concerns of performing an evaluation and management service by telephone and the availability of in person appointments. I also discussed with the patient that there may be a patient responsible charge related to this service. The patient expressed understanding and agreed to proceed.   History of Present Illness: [name] has a past medical history of Arthritis, Eczema, GERD (gastroesophageal reflux disease), High cholesterol, Hypertension, Obesity, Seasonal allergies, and Vitamin D deficiency.  Patient presents for follow-up for her chronic medical conditions  Bronchitis .patient was at the emergency department on 12/04/2022 for complaints of cough, shortness of breath, she was diagnosed with bronchitis she was prescribed prednisone, guaifenesin-codeine syrup, albuterol sulfate nebulizer for wheezing and shortness of breath.  BMP was normal troponin normal RVP normal, DG chest suggestive of small airways disease/bronchitis.  Stated that she was not able to fill the prescription for guaifenesin-codeine syrup and needs this to be sent to her pharmacy.  She currently denies fever, chest pain.  Still coughing and has shortness of breath.  Has history of allergies and does not smoke cigarettes.  Hypertension.  Has hydrochlorothiazide 12.5 mg daily ordered but has been out of the medication.  Patient denies chest pain, dizziness, she reports bilateral lower extremity edema  Hyperlipidemia.  Has atorvastatin 10 mg daily ordered but has been out of the medication  Obesity.  Followed by bariatric surgery at [facility], taking metformin 500 mg twice daily.  She desiresVertical  Sleeve Gastrectomy.  Her program was put on hold due to her abnormal her abnormal bleeding which has since stopped after having a D&C hysteroscopy.doing some chair exercises and plans on following a carb diet as she got off tract during the holidays   Observations/Objective: Patient alert and oriented, no sign of distress noted  Assessment and Plan: Vitamin D deficiency Last vitamin D Lab Results  Component Value Date   VD25OH 33.2 01/15/2023  Patient told to take vitamin D 1000 units daily We will recheck labs at next visit  Morbid obesity (HCC) Wt Readings from Last 3 Encounters:  12/08/23 (!) 380 lb (172.4 kg)  12/05/23 (!) 380 lb (172.4 kg)  04/19/23 (!) 378 lb 9.6 oz (171.7 kg)   Body mass index is 74.21 kg/m.  Continue metformin 500 mg twice daily Patient encouraged to maintain close follow-up with bariatric surgery Encouraged to follow a low-carb diet and engage in regular moderate exercises at least 150 minutes weekly as tolerated  Hypertension, essential BP Readings from Last 3 Encounters:  12/08/23 (!) 148/85  12/05/23 (!) 174/79  04/19/23 (!) 136/56  Hydrochlorothiazide 12.5 mg daily refilled Continue current medications. No changes in management. Discussed DASH diet and dietary sodium restrictions Continue to increase dietary efforts and exercise.  Follow-up in the office in 4 months    Hypercholesteremia Lab Results  Component Value Date   CHOL 172 01/15/2023   HDL 52 01/15/2023   LDLCALC 108 (H) 01/15/2023   TRIG 64 01/15/2023   CHOLHDL 3.3 01/15/2023  Atorvastatin 10 mg daily refilled We will recheck labs at next visit  Bilateral leg edema Working on losing weight Encouraged to wear compression socks keep legs elevated to help decrease swelling,  DASH diet advised Hydrochlorothiazide 12.5 mg daily refilled  Bronchitis Encouraged to complete full course of steroid ordered Continue albuterol nebulizer as needed as needed every 6 hours as needed Will  referred patient to allergist due to complaints of frequent bronchitis, wheezing, and allergy symptoms - guaiFENesin-codeine 100-10 MG/5ML syrup; Take 5 mLs by mouth 3 (three) times daily as needed for cough.  Dispense: 120 mL; Refill: 0    Chronic rhinitis Continue Flonase nasal spray 1 spray twice daily, cetirizine 10 mg daily Avoid allergens Patient referred to allergist   Encounter for examination following treatment at hospital Hospital chart reviewed, including discharge summary Medications reconciled and reviewed with the patient in detail    Follow Up Instructions:    I discussed the assessment and treatment plan with the patient. The patient was provided an opportunity to ask questions and all were answered. The patient agreed with the plan and demonstrated an understanding of the instructions.   The patient was advised to call back or seek an in-person evaluation if the symptoms worsen or if the condition fails to improve as anticipated.

## 2023-12-08 NOTE — Assessment & Plan Note (Addendum)
Encouraged to complete full course of steroid ordered Continue albuterol nebulizer as needed as needed every 6 hours as needed Will referred patient to allergist due to complaints of frequent bronchitis, wheezing, and allergy symptoms - guaiFENesin-codeine 100-10 MG/5ML syrup; Take 5 mLs by mouth 3 (three) times daily as needed for cough.  Dispense: 120 mL; Refill: 0

## 2023-12-08 NOTE — Assessment & Plan Note (Signed)
Wt Readings from Last 3 Encounters:  12/08/23 (!) 380 lb (172.4 kg)  12/05/23 (!) 380 lb (172.4 kg)  04/19/23 (!) 378 lb 9.6 oz (171.7 kg)   Body mass index is 74.21 kg/m.  Continue metformin 500 mg twice daily Patient encouraged to maintain close follow-up with bariatric surgery Encouraged to follow a low-carb diet and engage in regular moderate exercises at least 150 minutes weekly as tolerated

## 2023-12-08 NOTE — Assessment & Plan Note (Addendum)
Last vitamin D Lab Results  Component Value Date   VD25OH 33.2 01/15/2023  Patient told to take vitamin D 1000 units daily We will recheck labs at next visit

## 2023-12-08 NOTE — Assessment & Plan Note (Signed)
 Hospital chart reviewed, including discharge summary Medications reconciled and reviewed with the patient in detail

## 2023-12-08 NOTE — Assessment & Plan Note (Signed)
Lab Results  Component Value Date   CHOL 172 01/15/2023   HDL 52 01/15/2023   LDLCALC 108 (H) 01/15/2023   TRIG 64 01/15/2023   CHOLHDL 3.3 01/15/2023  Atorvastatin 10 mg daily refilled We will recheck labs at next visit

## 2023-12-09 ENCOUNTER — Telehealth: Payer: Self-pay

## 2023-12-09 NOTE — Transitions of Care (Post Inpatient/ED Visit) (Cosign Needed)
12/09/2023  Name: Jennifer [name] MRN: [id number] DOB: 04-17-1966  Today's TOC FU Call Status: Today's TOC FU Call Status:: Successful TOC FU Call Completed TOC FU Call Complete Date: 12/09/23 Patient's Name and Date of Birth confirmed.  Transition Care Management Follow-up Telephone Call Date of Discharge: 12/06/23 Discharge Facility: [facility] Type of Discharge: Emergency Department Reason for ED Visit: Respiratory Respiratory Diagnosis: Pnuemonia How have you been since you were released from the hospital?: Better Any questions or concerns?: No  Items Reviewed: Did you receive and understand the discharge instructions provided?: Yes Medications obtained,verified, and reconciled?: Yes (Medications Reviewed) Any new allergies since your discharge?: No Dietary orders reviewed?: NA Do you have support at home?: Yes  Medications Reviewed Today: Medications Reviewed Today     Reviewed by Renelda Loma, RMA (Registered Medical Assistant) on 12/09/23 at 1330  Med List Status: <None>   Medication Order Taking? Sig Documenting Provider Last Dose Status Informant  acetaminophen (TYLENOL) 500 MG tablet [id number] Yes Frequency:   Dosage:0   MG  Instructions:  Note:1 tab by mouth as needed for pain every 4 hours (Tylenol),take with food. [provider] Taking Active            Med Note Brooke Dare, MICHELE A   Fri Jun 26, 2016  9:45 AM) Received from: [facility]  albuterol (PROVENTIL) (5 MG/ML) 0.5% nebulizer solution [id number] Yes Take 0.5 mLs (2.5 mg total) by nebulization every 6 (six) hours as needed for wheezing or shortness of breath. Smoot, Shawn Route, PA-C Taking Active   atorvastatin (LIPITOR) 10 MG tablet [id number] Yes Take 1 tablet (10 mg total) by mouth daily. Donell Beers, FNP Taking Active   cetirizine (ZYRTEC) 10 MG tablet [id number] Yes Take 10 mg by mouth daily. [provider] Taking Active            Med Note Junius Finner, [facility]    Mon Apr 19, 2023  9:07 AM) PRN   famotidine (PEPCID) 20 MG tablet [id number] No Take 1 tablet (20 mg total) by mouth 2 (two) times daily.  Patient not taking: Reported on 12/09/2023   Franne Forts, DO Not Taking Active            Med Note Junius Finner, [facility]   Mon Apr 19, 2023  9:07 AM) PRN   fluticasone (FLONASE) 50 MCG/ACT nasal spray [id number] Yes Place 1 spray into both nostrils 2 (two) times daily. Donell Beers, FNP Taking Active   guaiFENesin-codeine 100-10 MG/5ML syrup [id number] Yes Take 5 mLs by mouth 3 (three) times daily as needed for cough. Donell Beers, FNP Taking Active   hydrochlorothiazide (HYDRODIURIL) 12.5 MG tablet [id number] Yes Take 1 tablet (12.5 mg total) by mouth daily. Donell Beers, FNP Taking Active   ibuprofen (ADVIL) 800 MG tablet [id number] Yes Take 1 tablet (800 mg total) by mouth every 8 (eight) hours as needed for moderate pain. Glynn Octave, MD Taking Active            Med Note [facility], [facility]   Mon Apr 19, 2023  9:08 AM) PRN   meclizine (ANTIVERT) 25 MG tablet [id number] No Take 1 tablet (25 mg total) by mouth 3 (three) times daily as needed for dizziness.  Patient not taking: Reported on 12/09/2023   Marily Memos, MD Not Taking Active            Med Note Junius Finner, [facility]   Mon Apr 19, 2023  9:08 AM) PRN   metFORMIN (GLUCOPHAGE)  500 MG tablet [id number] Yes Take 1 tablet (500 mg total) by mouth 2 (two) times daily with a meal. Paseda, Baird Kay, FNP Taking Active   methylPREDNISolone (MEDROL DOSEPAK) 4 MG TBPK tablet [id number] Yes Take as directed on package Smoot, Shawn Route, PA-C Taking Active   triamcinolone ointment (KENALOG) 0.1 % [id number]   Alfonse Spruce, MD  Active   Vitamin D, Ergocalciferol, (DRISDOL) 1.25 MG (50000 UNIT) CAPS capsule [id number] No Take 50,000 Units by mouth once a week.  Patient not taking: Reported on 12/09/2023   [provider] Not Taking Active              Home Care and Equipment/Supplies: Were Home Health Services Ordered?: NA Any new equipment or medical supplies ordered?: NA  Functional Questionnaire: Do you need assistance with bathing/showering or dressing?: No Do you need assistance with meal preparation?: No Do you need assistance with eating?: No Do you have difficulty maintaining continence: No Do you need assistance with getting out of bed/getting out of a chair/moving?: No Do you have difficulty managing or taking your medications?: No  Follow up appointments reviewed: PCP Follow-up appointment confirmed?: Yes Date of PCP follow-up appointment?: 12/08/23 Follow-up Provider: [facility] Follow-up appointment confirmed?: NA Do you need transportation to your follow-up appointment?: No Do you understand care options if your condition(s) worsen?: Yes-patient verbalized understanding  SDOH Interventions Today    Flowsheet Row Most Recent Value  SDOH Interventions   Food Insecurity Interventions Intervention Not Indicated  Housing Interventions Intervention Not Indicated  Transportation Interventions Intervention Not Indicated  Utilities Interventions Intervention Not Indicated       SIGNATURE.  Renelda Loma RMA

## 2024-01-17 ENCOUNTER — Ambulatory Visit (INDEPENDENT_AMBULATORY_CARE_PROVIDER_SITE_OTHER): Payer: 59 | Admitting: Internal Medicine

## 2024-01-17 ENCOUNTER — Encounter: Payer: Self-pay | Admitting: Internal Medicine

## 2024-01-17 VITALS — BP 132/96 | HR 85 | Temp 97.5°F | Resp 18 | Ht 60.0 in | Wt 385.4 lb

## 2024-01-17 DIAGNOSIS — J4530 Mild persistent asthma, uncomplicated: Secondary | ICD-10-CM | POA: Diagnosis not present

## 2024-01-17 DIAGNOSIS — J3089 Other allergic rhinitis: Secondary | ICD-10-CM

## 2024-01-17 DIAGNOSIS — K219 Gastro-esophageal reflux disease without esophagitis: Secondary | ICD-10-CM | POA: Diagnosis not present

## 2024-01-17 DIAGNOSIS — B999 Unspecified infectious disease: Secondary | ICD-10-CM

## 2024-01-17 MED ORDER — CARBINOXAMINE MALEATE 4 MG PO TABS: 4.0000 mg | ORAL_TABLET | Freq: Three times a day (TID) | ORAL | 5 refills | Status: DC

## 2024-01-17 MED ORDER — FLUTICASONE PROPIONATE HFA 44 MCG/ACT IN AERO: 2.0000 | INHALATION_SPRAY | Freq: Two times a day (BID) | RESPIRATORY_TRACT | 12 refills | Status: DC

## 2024-01-17 NOTE — Patient Instructions (Addendum)
 Recurrent sinus infections and bronchitis Jennifer [name] experiences recurrent sinus infections and bronchitis, leading to multiple ER visits. Symptoms include rhinorrhea, nasal congestion, epiphora, dry cough, and production of thick green sputum, worsening in fall and winter and improving in summer.  Breathing tests: were normal  - Start flovent 2 puffs twice daily  - Continue albuterol 2 puffs every 4-6 hours as needed for cough, wheeze, dyspnea   Allergic rhinitis Jennifer [name] presents with allergic rhinitis, characterized by rhinorrhea, nasal congestion, epiphora, and post-nasal drip. Previous allergy testing indicated sensitivities to [place] grass, Cladosporium, dust mite, dog, [place], and Mucor. Current management includes cetirizine and fluticasone, but cetirizine is no longer effective. Immunotherapy is a potential treatment to retrain the immune system and potentially induce tolerance to allergens such as dog. - follow up for allergy testing (1-55)  - Start carbinoxamine 4mg  2-3 times per day - Continue flonase 1 spray per nostril twice daily  - Discuss allergen avoidance strategies. - Consider immunotherapy to retrain the immune system and potentially induce tolerance to allergens.  Gastroesophageal reflux disease (GERD) Jennifer [name]'s GERD is managed with pantoprazole and over-the-counter calcium carbonate. Symptoms are generally well-controlled, with pantoprazole used as needed for severe symptoms. - Continue management with pantoprazole and calcium carbonate as needed.  Follow up: for allergy testing (1-55) hold carbinoxamine for 3 days prior   Thank you so much for letting me partake in your care today.  Don't hesitate to reach out if you have any additional concerns!  Ferol Luz, MD  [facility] [place] [place]

## 2024-01-17 NOTE — Progress Notes (Signed)
 NEW PATIENT Date of Service/Encounter:  01/17/24 Referring provider: Donell Beers, FNP Primary care provider: Donell Beers, FNP  Subjective:  Jennifer [name] is a 58 y.o. female  presenting today for evaluation of recurrent infections, bronchitis  History obtained from: chart review and patient.   Discussed the use of AI scribe software for clinical note transcription with the patient, who gave verbal consent to proceed.  History of Present Illness Jennifer [name] is a 58 year old female who presents for allergy evaluation. She was referred by the ER for evaluation of recurrent sinus infections and bronchitis.  She has a long-standing history of upper airway symptoms, including rhinorrhea, nasal congestion, epiphora, and post-nasal drip, particularly noticeable in the mornings. These symptoms have been present for most of her life and tend to worsen during the fall and winter months, as well as at the beginning of spring. No exacerbation of symptoms due to animals, as she owns a dog.  She experiences a dry cough, which sometimes produces thick green mucus from her chest. This cough has been persistent for a while, and she has been treated with steroids three times in the past year, which she finds helpful. She was last in the ER in February, where she was treated with antibiotics for pneumonia. She has been prescribed albuterol, initially as an inhaler and more recently as a nebulizer.  She underwent allergy testing in 2023 at [facility], which identified allergies to [place] grass, cladosporium, dust mites, and mucor. She has been using Zyrtec and Flonase daily for approximately five to six years to manage her allergy symptoms, although she feels that Zyrtec is no longer effective. She also uses a nasal spray and a humidifier as part of her management strategy. She denies any personal history of asthma, although her father had asthma.  She has a history of gastroesophageal  reflux disease, for which she takes pantoprazole as needed, supplemented by over-the-counter Tums. This regimen generally controls her symptoms.      Other allergy screening: Asthma:  recurrent infections  Rhino conjunctivitis: yes Food allergy: no Medication allergy:  latex allergy  Hymenoptera allergy: no Urticaria: no Eczema:no History of recurrent infections suggestive of immunodeficency: no Vaccinations are up to date.   Past Medical History: Past Medical History:  Diagnosis Date   Arthritis    Eczema    GERD (gastroesophageal reflux disease)    High cholesterol    Hypertension    Obesity    Seasonal allergies    Vitamin D deficiency    Medication List:  Current Outpatient Medications  Medication Sig Dispense Refill   acetaminophen (TYLENOL) 500 MG tablet Frequency:   Dosage:0   MG  Instructions:  Note:1 tab by mouth as needed for pain every 4 hours (Tylenol),take with food.     albuterol (PROVENTIL) (5 MG/ML) 0.5% nebulizer solution Take 0.5 mLs (2.5 mg total) by nebulization every 6 (six) hours as needed for wheezing or shortness of breath. 20 mL 12   atorvastatin (LIPITOR) 10 MG tablet Take 1 tablet (10 mg total) by mouth daily. 90 tablet 1   Carbinoxamine Maleate 4 MG TABS Take 1 tablet (4 mg total) by mouth in the morning, at noon, and at bedtime. 90 tablet 5   fluticasone (FLONASE) 50 MCG/ACT nasal spray Place 1 spray into both nostrils 2 (two) times daily. 15.8 mL 3   fluticasone (FLOVENT HFA) 44 MCG/ACT inhaler Inhale 2 puffs into the lungs 2 (two) times daily. 1 each 12  guaiFENesin-codeine 100-10 MG/5ML syrup Take 5 mLs by mouth 3 (three) times daily as needed for cough. 120 mL 0   hydrochlorothiazide (HYDRODIURIL) 12.5 MG tablet Take 1 tablet (12.5 mg total) by mouth daily. 90 tablet 1   ibuprofen (ADVIL) 800 MG tablet Take 1 tablet (800 mg total) by mouth every 8 (eight) hours as needed for moderate pain. 21 tablet 0   metFORMIN (GLUCOPHAGE) 500 MG tablet  Take 1 tablet (500 mg total) by mouth 2 (two) times daily with a meal. 180 tablet 2   Vitamin D, Ergocalciferol, (DRISDOL) 1.25 MG (50000 UNIT) CAPS capsule Take 50,000 Units by mouth once a week.     famotidine (PEPCID) 20 MG tablet Take 1 tablet (20 mg total) by mouth 2 (two) times daily. (Patient not taking: Reported on 01/17/2024) 30 tablet 0   meclizine (ANTIVERT) 25 MG tablet Take 1 tablet (25 mg total) by mouth 3 (three) times daily as needed for dizziness. (Patient not taking: Reported on 12/08/2023) 30 tablet 0   methylPREDNISolone (MEDROL DOSEPAK) 4 MG TBPK tablet Take as directed on package (Patient not taking: Reported on 01/17/2024) 1 each 0   Current Facility-Administered Medications  Medication Dose Route Frequency Provider Last Rate Last Admin   triamcinolone ointment (KENALOG) 0.1 %   Topical BID Alfonse Spruce, MD       Known Allergies:  Allergies  Allergen Reactions   Latex Other (See Comments) and Hives    Powder causes hives  hives   Past Surgical History: Past Surgical History:  Procedure Laterality Date   CESAREAN SECTION     TONSILLECTOMY     TUBAL LIGATION     Family History: Family History  Problem Relation Age of Onset   Hepatitis B Mother    High blood pressure Mother    GER disease Father    High blood pressure Father    Allergic rhinitis Sister    Allergic rhinitis Sister    Sinusitis Sister    Colon cancer Daughter    Angioedema Neg Hx    Eczema Neg Hx    Urticaria Neg Hx    Immunodeficiency Neg Hx    Social History: Jennifer [name] lives [facility] that is 58 years old, no water damage or roaches, no dust mite precautions, one dog without access to bedroom, works as a Building surveyor, no smoke exposure .   ROS:  All other systems negative except as noted per HPI.  Objective:  Blood pressure (!) 132/96, pulse 85, temperature (!) 97.5 F (36.4 C), temperature source Temporal, resp. rate 18, height 5' (1.524 m), weight (!) 385 lb 6.4 oz (174.8 kg),  last menstrual period 04/07/2017, SpO2 97%. Body mass index is 75.27 kg/m. Physical Exam:  General Appearance:  Alert, cooperative, no distress, appears stated age  Head:  Normocephalic, without obvious abnormality, atraumatic  Eyes:  Conjunctiva clear, EOM's intact  Ears EACs normal bilaterally  Nose: Nares normal,  pink, edematous mucosa with clear rhinnorrhea , no visible anterior polyps, and septum midline  Throat: Lips, tongue normal; teeth and gums normal, + cobblestoning  Neck: Supple, symmetrical  Lungs:   clear to auscultation bilaterally, Respirations unlabored, no coughing  Heart:  regular rate and rhythm and no murmur, Appears well perfused  Extremities: No edema  Skin: Skin color, texture, turgor normal and no rashes or lesions on visualized portions of skin  Neurologic: No gross deficits   Diagnostics: Spirometry:  Tracings reviewed. Her effort: Good reproducible efforts. FVC: 2.12L (pre), 2.22L  (  post) FEV1: 1.95L, 102% predicted (pre), 2.00L, 104% predicted (post) FEV1/FVC ratio: 92 (pre), 90 (post) Interpretation: Spirometry consistent with normal pattern.  No significant post bronchodilator response  Please see scanned spirometry results for details.   Labs:  Lab Orders  No laboratory test(s) ordered today     Assessment and Plan  Assessment and Plan Assessment & Plan  Recurrent sinus infections and bronchitis Jennifer [name] experiences recurrent sinus infections and bronchitis, leading to multiple ER visits. Symptoms include rhinorrhea, nasal congestion, epiphora, dry cough, and production of thick green sputum, worsening in fall and winter and improving in summer.  Breathing tests: were normal  - Start flovent 2 puffs twice daily  - Continue albuterol 2 puffs every 4-6 hours as needed for cough, wheeze, dyspnea   Allergic rhinitis Jennifer [name] presents with allergic rhinitis, characterized by rhinorrhea, nasal congestion, epiphora, and post-nasal drip.  Previous allergy testing indicated sensitivities to [place] grass, Cladosporium, dust mite, dog, [place], and Mucor. Current management includes cetirizine and fluticasone, but cetirizine is no longer effective. Immunotherapy is a potential treatment to retrain the immune system and potentially induce tolerance to allergens such as dog. - follow up for allergy testing (1-55)  - Start carbinoxamine 4mg  2-3 times per day - Continue flonase 1 spray per nostril twice daily  - Discuss allergen avoidance strategies. - Consider immunotherapy to retrain the immune system and potentially induce tolerance to allergens.  Gastroesophageal reflux disease (GERD) Jennifer [name]'s GERD is managed with pantoprazole and over-the-counter calcium carbonate. Symptoms are generally well-controlled, with pantoprazole used as needed for severe symptoms. - Continue management with pantoprazole and calcium carbonate as needed.  Follow up: for allergy testing (1-55) hold carbinoxamine for 3 days prior      This note in its entirety was forwarded to the Provider who requested this consultation.  Other: reviewed spirometry technique and reviewed inhaler technique  Thank you for your kind referral. I appreciate the opportunity to take part in [place] care. Please do not hesitate to contact me with questions.  Sincerely,  Thank you so much for letting me partake in your care today.  Don't hesitate to reach out if you have any additional concerns!  Ferol Luz, MD  [facility] [place] [place]

## 2024-01-18 ENCOUNTER — Other Ambulatory Visit: Payer: Self-pay

## 2024-01-22 NOTE — Patient Instructions (Incomplete)
 Recurrent sinus infections and bronchitis Jennifer [name] experiences recurrent sinus infections and bronchitis, leading to multiple ER visits. Symptoms include rhinorrhea, nasal congestion, epiphora, dry cough, and production of thick green sputum, worsening in fall and winter and improving in summer.  Breathing test at last office visit was normal - Continue flovent 2 puffs twice daily  - Continue albuterol 2 puffs every 4-6 hours as needed for cough, wheeze, dyspnea   Allergic rhinitis Jennifer [name] presents with allergic rhinitis, characterized by rhinorrhea, nasal congestion, epiphora, and post-nasal drip. Previous allergy testing indicated sensitivities to [place] grass, Cladosporium, dust mite, dog, [place], and Mucor. Current management includes cetirizine and fluticasone, but cetirizine is no longer effective. Immunotherapy is a potential treatment to retrain the immune system and potentially induce tolerance to allergens such as dog. - Skin testing today was - Continue carbinoxamine 4mg  2-3 times per day - Continue flonase 1 spray per nostril twice daily  - Start allergen avoidance measures as below - Consider immunotherapy to retrain the immune system and potentially induce tolerance to allergens.  Gastroesophageal reflux disease (GERD) Jennifer [name]'s GERD is managed with pantoprazole and over-the-counter calcium carbonate. Symptoms are generally well-controlled, with pantoprazole used as needed for severe symptoms. - Continue management with pantoprazole and calcium carbonate as needed.  Follow up:

## 2024-01-24 ENCOUNTER — Encounter: Payer: Self-pay | Admitting: Family

## 2024-01-24 ENCOUNTER — Ambulatory Visit (INDEPENDENT_AMBULATORY_CARE_PROVIDER_SITE_OTHER): Admitting: Family

## 2024-01-24 DIAGNOSIS — J302 Other seasonal allergic rhinitis: Secondary | ICD-10-CM | POA: Diagnosis not present

## 2024-01-24 DIAGNOSIS — J3089 Other allergic rhinitis: Secondary | ICD-10-CM

## 2024-01-24 NOTE — Progress Notes (Signed)
 Date of Service/Encounter:  01/24/24  Allergy testing appointment   Initial visit on 01/17/24, seen for recurrent sinus infections and bronchitis, allergic rhinitis, and GERD.  Please see that note for additional details.  Today reports for allergy diagnostic testing:    DIAGNOSTICS:  Skin Testing: Environmental allergy panel. Adequate positive and negative controls Results discussed with patient/family.   Airborne Adult Perc - 01/24/24 0839     Time Antigen Placed 0845    Allergen Manufacturer Waynette Buttery    Location Back    Number of Test 55    1. Control-Buffer 50% Glycerol Negative    2. Control-Histamine 3+    3. Bahia Negative    4. [place] Negative    5. Johnson Negative    6. Kentucky Blue Negative    7. Meadow Fescue 2+    8. Perennial Rye 2+    9. Timothy Negative    10. Ragweed Mix Negative    11. Cocklebur Negative    12. Plantain,  English Negative    13. Baccharis Negative    14. Dog Fennel Negative    15. Russian Thistle Negative    16. Lamb's Quarters Negative    17. Sheep Sorrell Negative    18. Rough Pigweed Negative    19. Marsh Elder, Rough Negative    20. Mugwort, Common Negative    21. Box, Elder Negative    22. Cedar, red Negative    23. Sweet Gum Negative    24. Pecan Pollen Negative    25. Pine Mix Negative    26. Walnut, Black Pollen Negative    27. Red Mulberry Negative    28. Ash Mix Negative    29. Birch Mix Negative    30. Beech American Negative    31. Cottonwood, [place] Negative    32. Hickory, White Negative    33. Maple Mix Negative    34. Oak, [place] Mix Negative    35. Sycamore Eastern Negative    36. Alternaria Alternata Negative    37. Cladosporium Herbarum Negative    38. Aspergillus Mix Negative    39. Penicillium Mix Negative    40. Bipolaris Sorokiniana (Helminthosporium) Negative    41. Drechslera Spicifera (Curvularia) Negative    42. Mucor Plumbeus Negative    43. Fusarium Moniliforme Negative    44.  Aureobasidium Pullulans (pullulara) Negative    45. Rhizopus Oryzae Negative    46. Botrytis Cinera Negative    47. Epicoccum Nigrum Negative    48. Phoma Betae Negative    49. Dust Mite Mix Negative    50. Cat Hair 10,000 BAU/ml Negative    51.  Dog Epithelia Negative    52. Mixed Feathers Negative    53. Horse Epithelia Negative    54. Cockroach, German Negative    55. Tobacco Leaf Negative             Intradermal - 01/24/24 0928     Time Antigen Placed 0915    Location Arm    Number of Test 15    Control 3+    Bahia Negative    [place] Negative    Johnson Negative    7 Grass Negative    Weed Mix Negative    Tree Mix Negative    Mold 1 Negative    Mold 2 Negative    Mold 3 Negative    Mold 4 Negative    Mite Mix 4+    Cat 3+    Dog 4+  Cockroach Negative              Allergy testing results were read and interpreted by myself, documented by clinical staff.  Patient provided with copy of allergy testing along with avoidance measures when indicated.   Recurrent sinus infections and bronchitis Jennifer [name] experiences recurrent sinus infections and bronchitis, leading to multiple ER visits. Symptoms include rhinorrhea, nasal congestion, epiphora, dry cough, and production of thick green sputum, worsening in fall and winter and improving in summer.  Breathing test at last office visit was normal - Continue flovent 2 puffs twice daily  - Continue albuterol 2 puffs every 4-6 hours as needed for cough, wheeze, dyspnea   Allergic rhinitis Jennifer [name] presents with allergic rhinitis, characterized by rhinorrhea, nasal congestion, epiphora, and post-nasal drip. Previous allergy testing indicated sensitivities to [place] grass, Cladosporium, dust mite, dog, [place], and Mucor. Current management includes cetirizine and fluticasone, but cetirizine is no longer effective. Immunotherapy is a potential treatment to retrain the immune system and potentially induce tolerance  to allergens such as dog. - Skin testing today was positive to meadow fescue and perennial rye with adequate controls. Intradermal skin testing was positive to dust mite mix, cat hair, and dog epithelia - Continue carbinoxamine 4mg  2-3 times per day - Continue flonase 1 spray per nostril twice daily  - Start allergen avoidance measures as below - Consider immunotherapy to retrain the immune system and potentially induce tolerance to allergens.  Gastroesophageal reflux disease (GERD) Jennifer [name]'s GERD is managed with pantoprazole and over-the-counter calcium carbonate. Symptoms are generally well-controlled, with pantoprazole used as needed for severe symptoms. - Continue management with pantoprazole and calcium carbonate as needed.  Follow up: 4-6 weeks or sooner if needed  Reducing Pollen Exposure The American Academy of Allergy, Asthma and Immunology suggests the following steps to reduce your exposure to pollen during allergy seasons. Do not hang sheets or clothing out to dry; pollen may collect on these items. Do not mow lawns or spend time around freshly cut grass; mowing stirs up pollen. Keep windows closed at night.  Keep car windows closed while driving. Minimize morning activities outdoors, a time when pollen counts are usually at their highest. Stay indoors as much as possible when pollen counts or humidity is high and on windy days when pollen tends to remain in the air longer. Use air conditioning when possible.  Many air conditioners have filters that trap the pollen spores. Use a HEPA room air filter to remove pollen form the indoor air you breathe.  Control of Dust Mite Allergen Dust mites play a major role in allergic asthma and rhinitis. They occur in environments with high humidity wherever human skin is found. Dust mites absorb humidity from the atmosphere (ie, they do not drink) and feed on organic matter (including shed human and animal skin). Dust mites are a microscopic  type of insect that you cannot see with the naked eye. High levels of dust mites have been detected from mattresses, pillows, carpets, upholstered furniture, bed covers, clothes, soft toys and any woven material. The principal allergen of the dust mite is found in its feces. A gram of dust may contain 1,000 mites and 250,000 fecal particles. Mite antigen is easily measured in the air during house cleaning activities. Dust mites do not bite and do not cause harm to humans, other than by triggering allergies/asthma.  Ways to decrease your exposure to dust mites in your home:  1. Encase mattresses, box springs and pillows with a  mite-impermeable barrier or cover  2. Wash sheets, blankets and drapes weekly in hot water (130 F) with detergent and dry them in a dryer on the hot setting.  3. Have the room cleaned frequently with a vacuum cleaner and a damp dust-mop. For carpeting or rugs, vacuuming with a vacuum cleaner equipped with a high-efficiency particulate air (HEPA) filter. The dust mite allergic individual should not be in a room which is being cleaned and should wait 1 hour after cleaning before going into the room.  4. Do not sleep on upholstered furniture (eg, couches).  5. If possible removing carpeting, upholstered furniture and drapery from the home is ideal. Horizontal blinds should be eliminated in the rooms where the person spends the most time (bedroom, study, television room). Washable vinyl, roller-type shades are optimal.  6. Remove all non-washable stuffed toys from the bedroom. Wash stuffed toys weekly like sheets and blankets above.  7. Reduce indoor humidity to less than 50%. Inexpensive humidity monitors can be purchased at most hardware stores. Do not use a humidifier as can make the problem worse and are not recommended.   Control of Dog or Cat Allergen Avoidance is the best way to manage a dog or cat allergy. If you have a dog or cat and are allergic to dog or cats,  consider removing the dog or cat from the home. If you have a dog or cat but don't want to find it a new home, or if your family wants a pet even though someone in the household is allergic, here are some strategies that may help keep symptoms at bay:  Keep the pet out of your bedroom and restrict it to only a few rooms. Be advised that keeping the dog or cat in only one room will not limit the allergens to that room. Don't pet, hug or kiss the dog or cat; if you do, wash your hands with soap and water. High-efficiency particulate air (HEPA) cleaners run continuously in a bedroom or living room can reduce allergen levels over time. Regular use of a high-efficiency vacuum cleaner or a central vacuum can reduce allergen levels. Giving your dog or cat a bath at least once a week can reduce airborne allergen.  Nehemiah Settle, FNP [facility]

## 2024-02-06 IMAGING — US US EXTREM LOW VENOUS*L*
1 series · 13 of 24 positions shown · non-contrast
Comparison: None Available.

CLINICAL DATA: 55-year-old female with left lower extremity pain
and swelling. Shortness of breath.

EXAM:
LEFT LOWER EXTREMITY VENOUS DOPPLER ULTRASOUND
TECHNIQUE: Gray-scale sonography with compression, as well as color and duplex
ultrasound, were performed to evaluate the deep venous system(s)
from the level of the common femoral vein through the popliteal and
proximal calf veins.

[Series 1: us extrem low venous*left* · 13 of 38 slices shown]
[im 1/38]
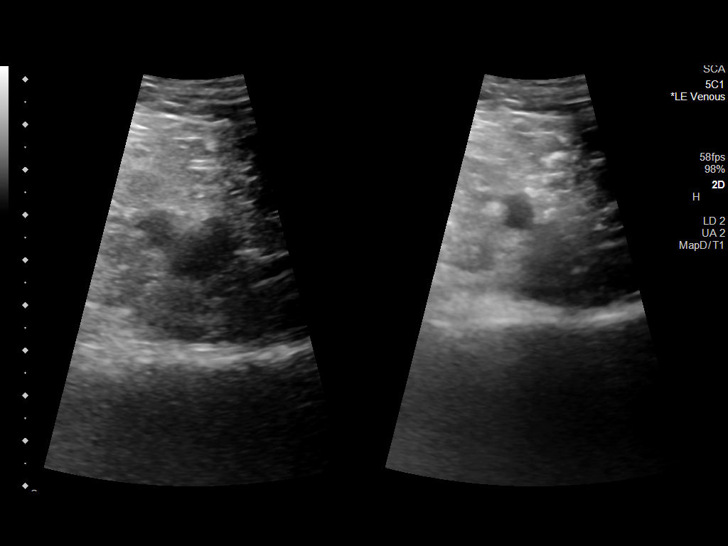
[im 4/38]
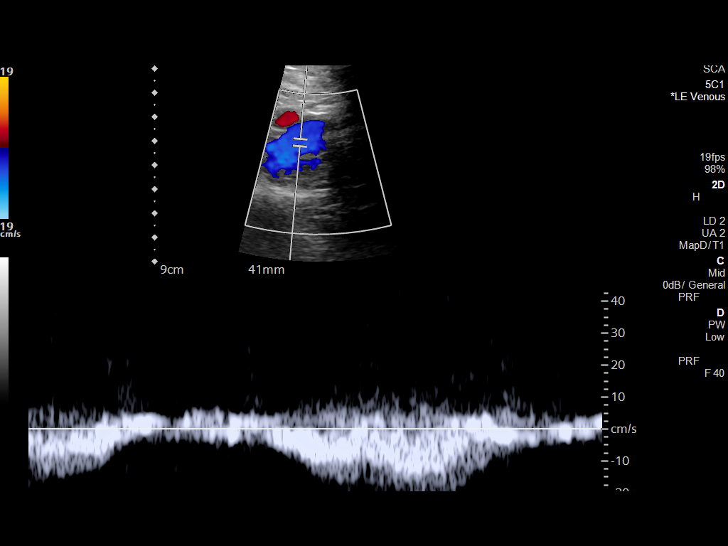
[im 7/38]
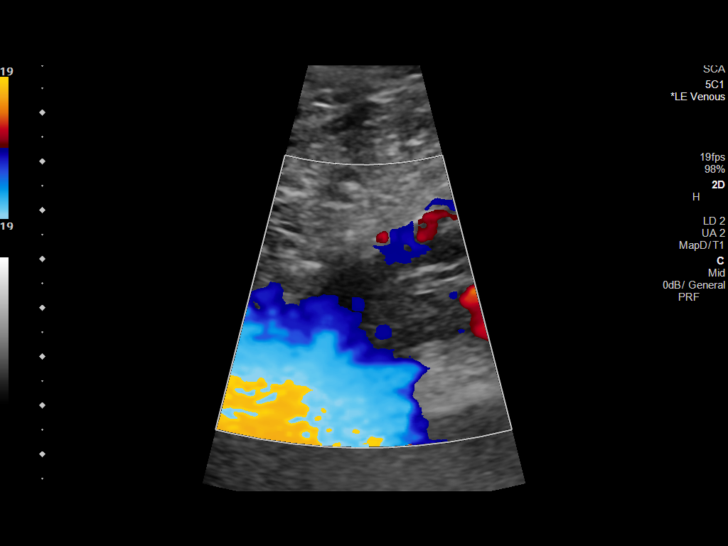
[im 10/38]
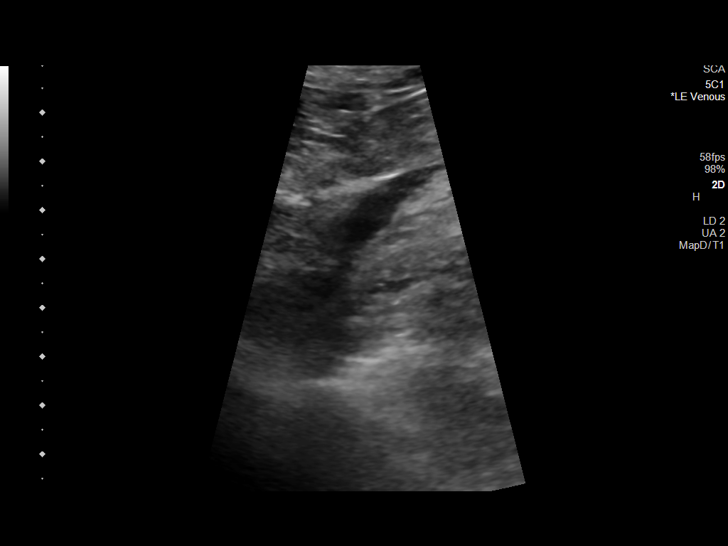
[im 13/38]
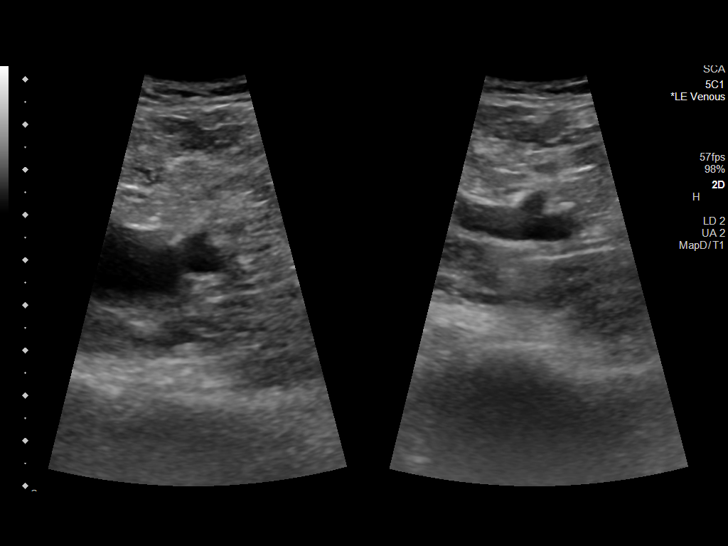
[im 17/38]
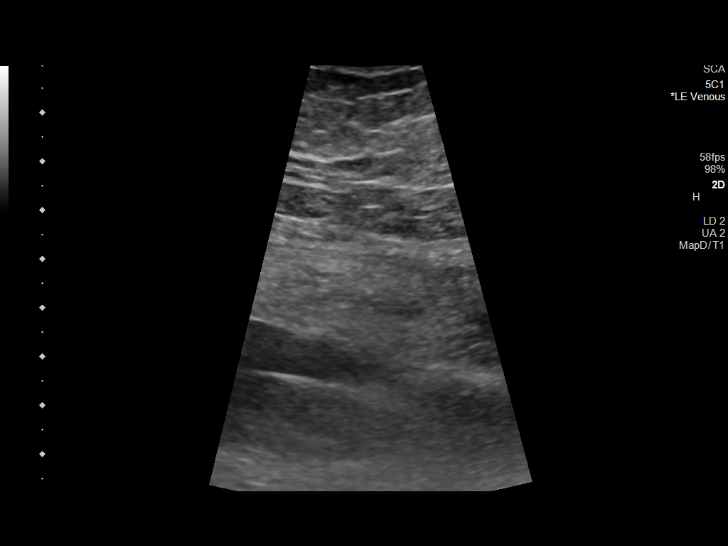
[im 20/38]
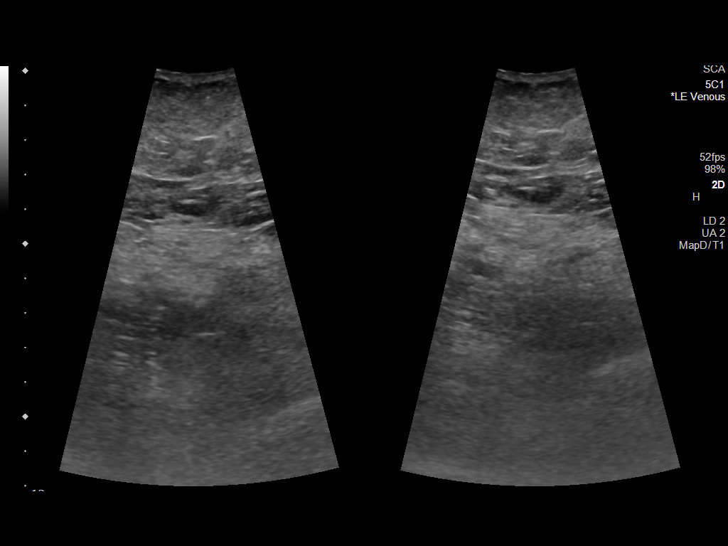
[im 21/38]
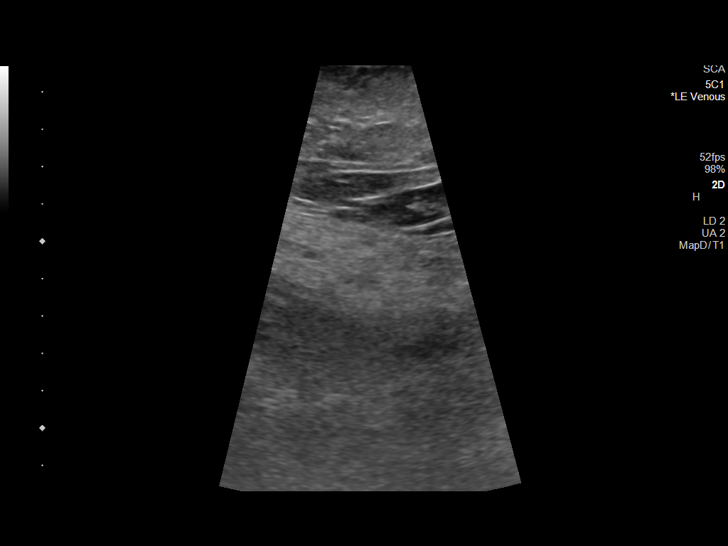
[im 25/38]
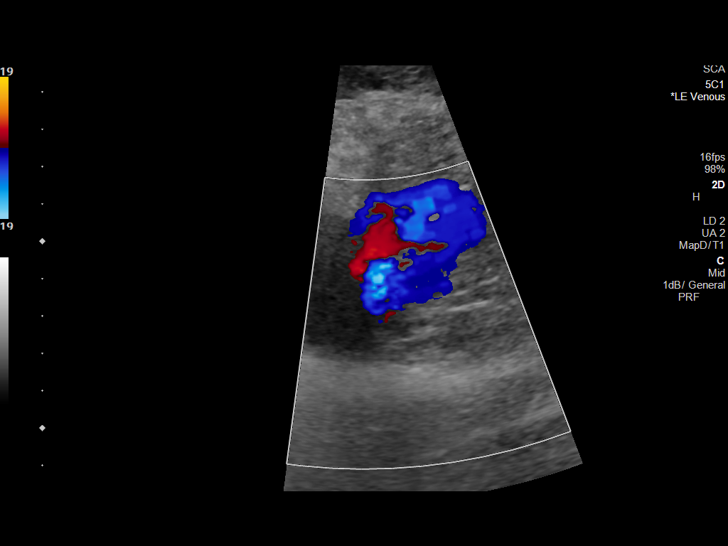
[im 28/38]
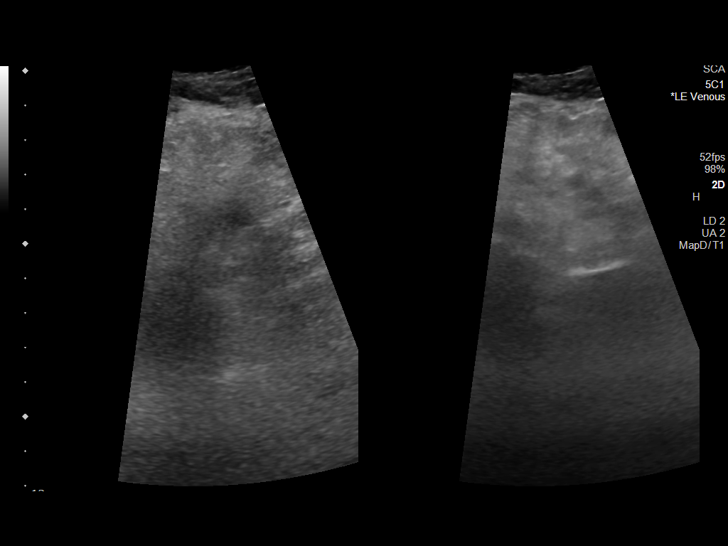
[im 31/38]
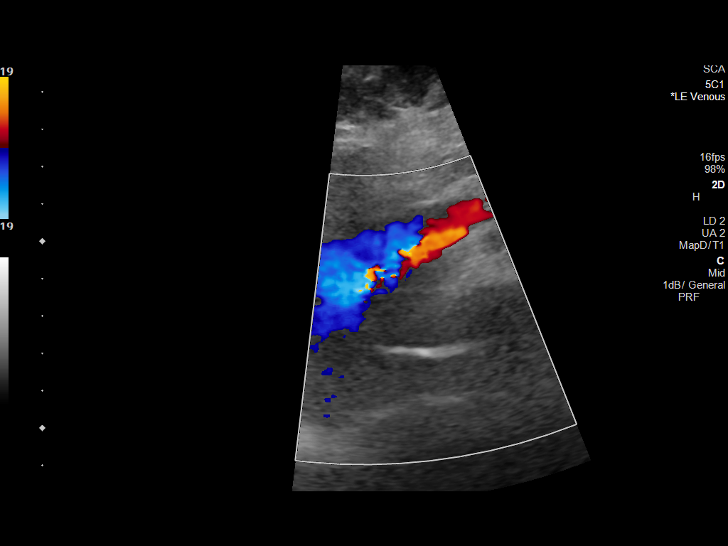
[im 34/38]
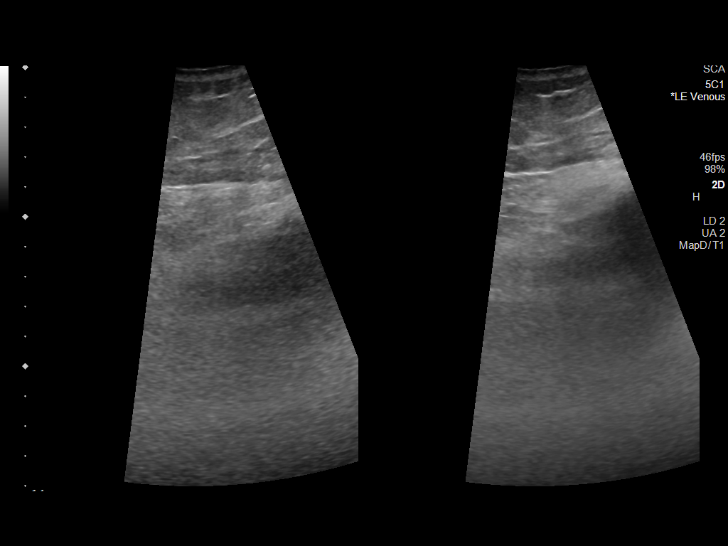
[im 38/38]
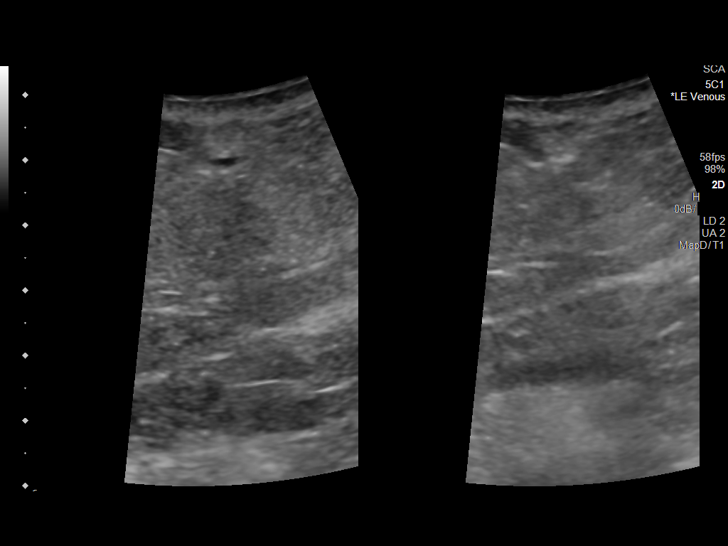

[13 of 24 positions shown; findings below may reference images not displayed]

FINDINGS: VENOUS

Normal compressibility of the common femoral, superficial femoral,
and popliteal veins. Visualized portions of profunda femoral vein
and great saphenous vein unremarkable.

Left calf veins are poorly visible on grayscale ultrasound, although
maintained Doppler flow is apparent (images 36-38).

No filling defects to suggest DVT on grayscale or color Doppler
imaging. Doppler waveforms show normal direction of venous flow,
normal respiratory plasticity and response to augmentation.

Limited views of the contralateral common femoral vein are
unremarkable.

OTHER

None.

Limitations: Large body habitus, obesity degrades image detail
throughout.
IMPRESSION: Degraded exam due to large body habitus but no evidence of left
lower extremity deep venous thrombosis.

## 2024-03-01 ENCOUNTER — Encounter: Payer: Self-pay | Admitting: Nurse Practitioner

## 2024-03-01 ENCOUNTER — Ambulatory Visit (INDEPENDENT_AMBULATORY_CARE_PROVIDER_SITE_OTHER): Admitting: Nurse Practitioner

## 2024-03-01 VITALS — BP 143/74 | HR 83 | Temp 97.3°F | Wt 380.0 lb

## 2024-03-01 DIAGNOSIS — I890 Lymphedema, not elsewhere classified: Secondary | ICD-10-CM | POA: Insufficient documentation

## 2024-03-01 DIAGNOSIS — M179 Osteoarthritis of knee, unspecified: Secondary | ICD-10-CM | POA: Insufficient documentation

## 2024-03-01 DIAGNOSIS — I10 Essential (primary) hypertension: Secondary | ICD-10-CM

## 2024-03-01 DIAGNOSIS — E559 Vitamin D deficiency, unspecified: Secondary | ICD-10-CM

## 2024-03-01 DIAGNOSIS — G8929 Other chronic pain: Secondary | ICD-10-CM | POA: Insufficient documentation

## 2024-03-01 DIAGNOSIS — D649 Anemia, unspecified: Secondary | ICD-10-CM | POA: Insufficient documentation

## 2024-03-01 DIAGNOSIS — M170 Bilateral primary osteoarthritis of knee: Secondary | ICD-10-CM | POA: Diagnosis not present

## 2024-03-01 DIAGNOSIS — N950 Postmenopausal bleeding: Secondary | ICD-10-CM | POA: Insufficient documentation

## 2024-03-01 MED ORDER — IBUPROFEN 800 MG PO TABS
800.0000 mg | ORAL_TABLET | Freq: Three times a day (TID) | ORAL | 1 refills | Status: DC | PRN
Start: 2024-03-01 — End: 2024-05-02

## 2024-03-01 MED ORDER — METHOCARBAMOL 500 MG PO TABS: 1000.0000 mg | ORAL_TABLET | Freq: Three times a day (TID) | ORAL | 0 refills | Status: DC | PRN

## 2024-03-01 NOTE — Assessment & Plan Note (Signed)
 Continue hydrochlorothiazide  12.5 mg daily DASH diet and commitment to daily physical activity for a minimum of 30 minutes discussed and encouraged, as a part of hypertension management. The importance of attaining a healthy weight is also discussed.     03/01/2024   11:23 AM 03/01/2024   11:07 AM 01/17/2024    9:25 AM 12/08/2023    8:10 AM 12/08/2023    8:08 AM 12/05/2023    8:20 PM 12/05/2023    4:32 PM  BP/Weight  Systolic BP 143 146 132 148 155 174 178  Diastolic BP 74 78 96 85 87 79 161  Wt. (Lbs)  380 385.4  380    BMI  74.21 kg/m2 75.27 kg/m2  74.21 kg/m2

## 2024-03-01 NOTE — Assessment & Plan Note (Signed)
 Last vitamin D  Lab Results  Component Value Date   VD25OH 33.2 01/15/2023  Currently not on vitamin D  supplement Checking labs

## 2024-03-01 NOTE — Assessment & Plan Note (Signed)
 Take ibuprofen  with food and alternate ibuprofen  with Tylenol  650 mg every 6 hours as needed Follow-up with orthopedics as planned - methocarbamol (ROBAXIN) 500 MG tablet; Take 2 tablets (1,000 mg total) by mouth every 8 (eight) hours as needed for muscle spasms.  Dispense: 60 tablet; Refill: 0 - ibuprofen  (ADVIL ) 800 MG tablet; Take 1 tablet (800 mg total) by mouth every 8 (eight) hours as needed for moderate pain (pain score 4-6).  Dispense: 30 tablet; Refill: 1 - For home use only DME [address]

## 2024-03-01 NOTE — Patient Instructions (Signed)
 1. Lymphedema (Primary) For the swelling in your lower extremities, be sure to elevate your legs when able, mind the salt intake, stay physically active and consider wearing compression stockings.   2. Chronic pain of both knees  - methocarbamol (ROBAXIN) 500 MG tablet; Take 2 tablets (1,000 mg total) by mouth every 8 (eight) hours as needed for muscle spasms.  Dispense: 60 tablet; Refill: 0 - ibuprofen  (ADVIL ) 800 MG tablet; Take 1 tablet (800 mg total) by mouth every 8 (eight) hours as needed for moderate pain (pain score 4-6).  Dispense: 30 tablet; Refill: 1 Please take ibuprofen  with food and alternate with Tylenol  650 mg every 6 hours as needed Please follow-up with orthopedics as planned   3. Vitamin D  deficiency  - VITAMIN D  25 Hydroxy (Vit-D Deficiency, Fractures)  4. Hypertension, essential  - Basic Metabolic Panel  5. Morbid obesity (HCC)   6. Anemia, unspecified type  - CBC   It is important that you exercise regularly at least 30 minutes 5 times a week as tolerated  Think about what you will eat, plan ahead. Choose " clean, green, fresh or frozen" over canned, processed or packaged foods which are more sugary, salty and fatty. 70 to 75% of food eaten should be vegetables and fruit. Three meals at set times with snacks allowed between meals, but they must be fruit or vegetables. Aim to eat over a 12 hour period , example 7 am to 7 pm, and STOP after  your last meal of the day. Drink water,generally about 64 ounces per day, no other drink is as healthy. Fruit juice is best enjoyed in a healthy way, by EATING the fruit.  Thanks for choosing [facility] we consider it a privelige to serve you.

## 2024-03-01 NOTE — Assessment & Plan Note (Signed)
 DASH diet, elevation and use of compression encouraged Patient referred to the lymphedema clinic

## 2024-03-01 NOTE — Progress Notes (Signed)
 Acute Office Visit  Subjective:     Patient ID: Jennifer [name], female    DOB: [birth year], 58 y.o.   MRN: [id number]  Chief Complaint  Patient presents with   Osteoarthritis    both    HPI Jennifer [name]  has a past medical history of Arthritis, Eczema, GERD (gastroesophageal reflux disease), High cholesterol, Hypertension, Obesity, Seasonal allergies, and Vitamin D  deficiency.    Bilateral knee osteoarthritis patient presents for follow-up for osteoarthritis of bilateral knee.  Recent x-ray showed severe tricompartmental osteoarthritis .  She is currently established with orthopedics and has an appointment with them this week Friday for steroid injection.  Currently taking OTC Aleve, and Tylenol  as needed she has completed full course of steroids previously ordered and out of Robaxin.  Her pain is currently rated 7/10.  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  Also considering knee replacement but she was told that she needs to lose weight.    Obesity she is currently established with bariatrics at [facility], she was prescribed metformin  but she has stopped taking the medication because it made her feel dizzy  Hypertension.  Currently on hydrochlorothiazide  12.5 mg daily.  Stated that she has not taking her medications today.  She denies chest pain, shortness of breath, has bilateral lower extremity edema          Review of Systems  Constitutional:  Negative for appetite change, chills, fatigue and fever.  HENT:  Negative for congestion, postnasal drip, rhinorrhea and sneezing.   Respiratory:  Negative for cough, shortness of breath and wheezing.   Cardiovascular:  Negative for chest pain, palpitations and leg swelling.  Gastrointestinal:  Negative for abdominal pain, constipation, nausea and vomiting.  Genitourinary:  Negative for difficulty urinating, dysuria, flank pain and frequency.  Musculoskeletal:  Positive  for arthralgias and myalgias. Negative for back pain and joint swelling.  Skin:  Negative for color change, pallor, rash and wound.  Neurological:  Negative for dizziness, facial asymmetry, weakness, numbness and headaches.  Psychiatric/Behavioral:  Negative for behavioral problems, confusion, self-injury and suicidal ideas.         Objective:    BP (!) 143/74   Pulse 83   Temp (!) 97.3 F (36.3 C)   Wt (!) 380 lb (172.4 kg)   LMP 04/07/2017   SpO2 98%   BMI 74.21 kg/m    Physical Exam Vitals and nursing note reviewed.  Constitutional:      General: She is not in acute distress.    Appearance: Normal appearance. She is obese. She is not ill-appearing, toxic-appearing or diaphoretic.  Eyes:     General: No scleral icterus.       Right eye: No discharge.        Left eye: No discharge.     Extraocular Movements: Extraocular movements intact.     Conjunctiva/sclera: Conjunctivae normal.  Cardiovascular:     Rate and Rhythm: Normal rate and regular rhythm.     Pulses: Normal pulses.     Heart sounds: Normal heart sounds. No murmur heard.    No friction rub. No gallop.  Pulmonary:     Effort: Pulmonary effort is normal. No respiratory distress.     Breath sounds: Normal breath sounds. No stridor. No wheezing, rhonchi or rales.  Chest:     Chest wall: No tenderness.  Abdominal:     General: There is no distension.  Palpations: Abdomen is soft.     Tenderness: There is no abdominal tenderness. There is no right CVA tenderness, left CVA tenderness or guarding.  Musculoskeletal:        General: Tenderness present. No swelling, deformity or signs of injury.     Right lower leg: Edema present.     Left lower leg: Edema present.     Comments: Tenderness on palpation of bilateral knee.  Skin warm and dry no redness.  Has pitting edema bilateral lower extremities  Skin:    General: Skin is warm and dry.     Capillary Refill: Capillary refill takes less than 2 seconds.      Coloration: Skin is not jaundiced or pale.     Findings: No bruising, erythema or lesion.  Neurological:     Mental Status: She is alert and oriented to person, place, and time.     Motor: No weakness.     Coordination: Coordination normal.     Gait: Gait normal.  Psychiatric:        Mood and Affect: Mood normal.        Behavior: Behavior normal.        Thought Content: Thought content normal.        Judgment: Judgment normal.     No results found for any visits on 03/01/24.      Assessment & Plan:   Problem List Items Addressed This Visit       Cardiovascular and Mediastinum   Hypertension, essential   Continue hydrochlorothiazide  12.5 mg daily DASH diet and commitment to daily physical activity for a minimum of 30 minutes discussed and encouraged, as a part of hypertension management. The importance of attaining a healthy weight is also discussed.     03/01/2024   11:23 AM 03/01/2024   11:07 AM 01/17/2024    9:25 AM 12/08/2023    8:10 AM 12/08/2023    8:08 AM 12/05/2023    8:20 PM 12/05/2023    4:32 PM  BP/Weight  Systolic BP 143 146 132 148 155 174 178  Diastolic BP 74 78 96 85 87 79 109  Wt. (Lbs)  380 385.4  380    BMI  74.21 kg/m2 75.27 kg/m2  74.21 kg/m2             Relevant Orders   Basic Metabolic Panel     Musculoskeletal and Integument   Knee osteoarthritis   Take ibuprofen  with food and alternate ibuprofen  with Tylenol  650 mg every 6 hours as needed Follow-up with orthopedics as planned - methocarbamol (ROBAXIN) 500 MG tablet; Take 2 tablets (1,000 mg total) by mouth every 8 (eight) hours as needed for muscle spasms.  Dispense: 60 tablet; Refill: 0 - ibuprofen  (ADVIL ) 800 MG tablet; Take 1 tablet (800 mg total) by mouth every 8 (eight) hours as needed for moderate pain (pain score 4-6).  Dispense: 30 tablet; Refill: 1 - For home use only D[facility]       Relevant Medications   methocarbamol (ROBAXIN) 500 MG tablet   ibuprofen  (ADVIL ) 800 MG tablet    Other Relevant Orders   For home use only DME [facility]     Other   Morbid obesity (HCC)   Wt Readings from Last 3 Encounters:  03/01/24 (!) 380 lb (172.4 kg)  01/17/24 (!) 385 lb 6.4 oz (174.8 kg)  12/08/23 (!) 380 lb (172.4 kg)   Body mass index is 74.21 kg/m.  Patient counseled on low-carb diet Encouraged to engage  in regular moderate exercise as tolerated Follow-up with bariatrics surgery      Relevant Orders   Ambulatory referral to Physical Therapy   Vitamin D  deficiency   Last vitamin D  Lab Results  Component Value Date   VD25OH 33.2 01/15/2023  Currently not on vitamin D  supplement Checking labs      Relevant Orders   VITAMIN D  25 Hydroxy (Vit-D Deficiency, Fractures)   Lymphedema - Primary   DASH diet, elevation and use of compression encouraged Patient referred to the lymphedema clinic      Relevant Orders   Ambulatory referral to Physical Therapy   For home use only DME [facility]   Anemia   Lab Results  Component Value Date   WBC 7.5 12/05/2023   HGB 11.1 (L) 12/05/2023   HCT 34.5 (L) 12/05/2023   MCV 92.5 12/05/2023   PLT 248 12/05/2023  Rechecking labs      Relevant Orders   CBC   Post-menopausal bleeding   Currently under control Has an IUD in place Pathology from [facility] was normal       Meds ordered this encounter  Medications   methocarbamol (ROBAXIN) 500 MG tablet    Sig: Take 2 tablets (1,000 mg total) by mouth every 8 (eight) hours as needed for muscle spasms.    Dispense:  60 tablet    Refill:  0   ibuprofen  (ADVIL ) 800 MG tablet    Sig: Take 1 tablet (800 mg total) by mouth every 8 (eight) hours as needed for moderate pain (pain score 4-6).    Dispense:  30 tablet    Refill:  1    No follow-ups on file.  Zion Lint R Gerry Heaphy, FNP

## 2024-03-01 NOTE — Assessment & Plan Note (Signed)
 Currently under control Has an IUD in place Pathology from [facility] was normal

## 2024-03-01 NOTE — Assessment & Plan Note (Signed)
 Wt Readings from Last 3 Encounters:  03/01/24 (!) 380 lb (172.4 kg)  01/17/24 (!) 385 lb 6.4 oz (174.8 kg)  12/08/23 (!) 380 lb (172.4 kg)   Body mass index is 74.21 kg/m.  Patient counseled on low-carb diet Encouraged to engage in regular moderate exercise as tolerated Follow-up with bariatrics surgery

## 2024-03-01 NOTE — Assessment & Plan Note (Signed)
 Lab Results  Component Value Date   WBC 7.5 12/05/2023   HGB 11.1 (L) 12/05/2023   HCT 34.5 (L) 12/05/2023   MCV 92.5 12/05/2023   PLT 248 12/05/2023  Rechecking labs

## 2024-03-02 LAB — BASIC METABOLIC PANEL WITH GFR
BUN/Creatinine Ratio: 14 (ref 9–23)
BUN: 11 mg/dL (ref 6–24)
CO2: 22 mmol/L (ref 20–29)
Calcium: 9.1 mg/dL (ref 8.7–10.2)
Chloride: 104 mmol/L (ref 96–106)
Creatinine, Ser: 0.78 mg/dL (ref 0.57–1.00)
Glucose: 93 mg/dL (ref 70–99)
Potassium: 4.8 mmol/L (ref 3.5–5.2)
Sodium: 139 mmol/L (ref 134–144)
eGFR: 88 mL/min/{1.73_m2} (ref >59)

## 2024-03-02 LAB — VITAMIN D 25 HYDROXY (VIT D DEFICIENCY, FRACTURES): Vit D, 25-Hydroxy: 29 ng/mL — ABNORMAL LOW (ref 30.0–100.0)

## 2024-03-02 LAB — CBC
Hematocrit: 39.8 % (ref 34.0–46.6)
Hemoglobin: 12.8 g/dL (ref 11.1–15.9)
MCH: 29.7 pg (ref 26.6–33.0)
MCHC: 32.2 g/dL (ref 31.5–35.7)
MCV: 92 fL (ref 79–97)
Platelets: 231 10*3/uL (ref 150–450)
RBC: 4.31 x10E6/uL (ref 3.77–5.28)
RDW: 13 % (ref 11.7–15.4)
WBC: 4.7 10*3/uL (ref 3.4–10.8)

## 2024-03-03 ENCOUNTER — Ambulatory Visit: Payer: Self-pay | Admitting: Nurse Practitioner

## 2024-03-03 ENCOUNTER — Other Ambulatory Visit: Payer: Self-pay | Admitting: Nurse Practitioner

## 2024-03-08 ENCOUNTER — Telehealth: Payer: Self-pay

## 2024-03-09 ENCOUNTER — Telehealth: Payer: Self-pay | Admitting: Nurse Practitioner

## 2024-03-09 ENCOUNTER — Other Ambulatory Visit: Payer: Self-pay

## 2024-03-09 DIAGNOSIS — M545 Low back pain, unspecified: Secondary | ICD-10-CM

## 2024-03-09 DIAGNOSIS — R600 Localized edema: Secondary | ICD-10-CM

## 2024-03-09 DIAGNOSIS — M170 Bilateral primary osteoarthritis of knee: Secondary | ICD-10-CM

## 2024-03-09 MED ORDER — ROLLATOR ULTRA-LIGHT MISC: 1.0000 | Status: AC | PRN

## 2024-03-09 NOTE — Patient Instructions (Addendum)
 Recurrent sinus infections and bronchitis Jennifer [name] experiences recurrent sinus infections and bronchitis, leading to multiple ER visits. Symptoms include rhinorrhea, nasal congestion, epiphora, dry cough, and production of thick green sputum, worsening in fall and winter and improving in summer.  Breathing test at last office visit was normal - Continue flovent  44mcg 2 puffs twice daily  - Continue albuterol  2 puffs every 4-6 hours as needed for cough, wheeze, dyspnea   Allergic rhinitis Jennifer [name] presents with allergic rhinitis, characterized by rhinorrhea, nasal congestion, epiphora, and post-nasal drip. Previous allergy  testing indicated sensitivities to [place] grass, Cladosporium, dust mite, dog, [place], and Mucor. Current management includes cetirizine and fluticasone , but cetirizine is no longer effective. Immunotherapy is a potential treatment to retrain the immune system and potentially induce tolerance to allergens such as dog. - Skin testing on 01/24/24 was positive to meadow fescue and perennial rye with adequate controls. Intradermal skin testing was positive to dust mite mix, cat hair, and dog epithelia - Continue carbinoxamine  4mg  2-3 times per day - Continue flonase  1 spray per nostril twice daily  - Continue allergen avoidance measures as below - Consider immunotherapy to retrain the immune system and potentially induce tolerance to allergens.  Gastroesophageal reflux disease (GERD)  Past history: Jennifer [name]'s GERD is managed with pantoprazole  and over-the-counter calcium  carbonate. Symptoms are generally well-controlled, with pantoprazole  used as needed for severe symptoms.Interim history : worsening symptoms since off pantoprazole  - Restart pantoprazole  and calcium  carbonate as needed.  Follow up:6 months or sooner if needed  Reducing Pollen Exposure The American Academy of Allergy , Asthma and Immunology suggests the following steps to reduce your exposure to pollen during allergy   seasons. Do not hang sheets or clothing out to dry; pollen may collect on these items. Do not mow lawns or spend time around freshly cut grass; mowing stirs up pollen. Keep windows closed at night.  Keep car windows closed while driving. Minimize morning activities outdoors, a time when pollen counts are usually at their highest. Stay indoors as much as possible when pollen counts or humidity is high and on windy days when pollen tends to remain in the air longer. Use air conditioning when possible.  Many air conditioners have filters that trap the pollen spores. Use a HEPA room air filter to remove pollen form the indoor air you breathe.  Control of Dust Mite Allergen Dust mites play a major role in allergic asthma and rhinitis. They occur in environments with high humidity wherever human skin is found. Dust mites absorb humidity from the atmosphere (ie, they do not drink) and feed on organic matter (including shed human and animal skin). Dust mites are a microscopic type of insect that you cannot see with the naked eye. High levels of dust mites have been detected from mattresses, pillows, carpets, upholstered furniture, bed covers, clothes, soft toys and any woven material. The principal allergen of the dust mite is found in its feces. A gram of dust may contain 1,000 mites and 250,000 fecal particles. Mite antigen is easily measured in the air during house cleaning activities. Dust mites do not bite and do not cause harm to humans, other than by triggering allergies/asthma.  Ways to decrease your exposure to dust mites in your home:  1. Encase mattresses, box springs and pillows with a mite-impermeable barrier or cover  2. Wash sheets, blankets and drapes weekly in hot water (130 F) with detergent and dry them in a dryer on the hot setting.  3. Have the room cleaned frequently with  a vacuum cleaner and a damp dust-mop. For carpeting or rugs, vacuuming with a vacuum cleaner equipped with a  high-efficiency particulate air (HEPA) filter. The dust mite allergic individual should not be in a room which is being cleaned and should wait 1 hour after cleaning before going into the room.  4. Do not sleep on upholstered furniture (eg, couches).  5. If possible removing carpeting, upholstered furniture and drapery from the home is ideal. Horizontal blinds should be eliminated in the rooms where the person spends the most time (bedroom, study, television room). Washable vinyl, roller-type shades are optimal.  6. Remove all non-washable stuffed toys from the bedroom. Wash stuffed toys weekly like sheets and blankets above.  7. Reduce indoor humidity to less than 50%. Inexpensive humidity monitors can be purchased at most hardware stores. Do not use a humidifier as can make the problem worse and are not recommended.   Control of Dog or Cat Allergen Avoidance is the best way to manage a dog or cat allergy . If you have a dog or cat and are allergic to dog or cats, consider removing the dog or cat from the home. If you have a dog or cat but don't want to find it a new home, or if your family wants a pet even though someone in the household is allergic, here are some strategies that may help keep symptoms at bay:  Keep the pet out of your bedroom and restrict it to only a few rooms. Be advised that keeping the dog or cat in only one room will not limit the allergens to that room. Don't pet, hug or kiss the dog or cat; if you do, wash your hands with soap and water. High-efficiency particulate air (HEPA) cleaners run continuously in a bedroom or living room can reduce allergen levels over time. Regular use of a high-efficiency vacuum cleaner or a central vacuum can reduce allergen levels. Giving your dog or cat a bath at least once a week can reduce airborne allergen.

## 2024-03-09 NOTE — Telephone Encounter (Signed)
 Must go to DME supply

## 2024-03-09 NOTE — Telephone Encounter (Signed)
 Done and sent to [facility] for help. Kh

## 2024-03-09 NOTE — Telephone Encounter (Signed)
 Copied from CRM [id number]. Topic: Clinical - Medication Refill >> Mar 09, 2024  8:47 AM Bearl Botts E wrote: Pt called requesting a prescription for a Rollator Walker, says her PT is concerned that she is at risk for falling due to swelling and arthritis.

## 2024-03-10 ENCOUNTER — Ambulatory Visit (INDEPENDENT_AMBULATORY_CARE_PROVIDER_SITE_OTHER): Admitting: Family

## 2024-03-10 ENCOUNTER — Other Ambulatory Visit: Payer: Self-pay

## 2024-03-10 ENCOUNTER — Telehealth: Payer: Self-pay

## 2024-03-10 ENCOUNTER — Encounter: Payer: Self-pay | Admitting: Family

## 2024-03-10 VITALS — BP 126/80 | HR 84 | Temp 97.9°F | Resp 18 | Wt 381.3 lb

## 2024-03-10 DIAGNOSIS — J302 Other seasonal allergic rhinitis: Secondary | ICD-10-CM | POA: Diagnosis not present

## 2024-03-10 DIAGNOSIS — J4530 Mild persistent asthma, uncomplicated: Secondary | ICD-10-CM | POA: Diagnosis not present

## 2024-03-10 DIAGNOSIS — K219 Gastro-esophageal reflux disease without esophagitis: Secondary | ICD-10-CM | POA: Diagnosis not present

## 2024-03-10 DIAGNOSIS — J3089 Other allergic rhinitis: Secondary | ICD-10-CM | POA: Diagnosis not present

## 2024-03-10 MED ORDER — PANTOPRAZOLE SODIUM 40 MG PO TBEC: 40.0000 mg | DELAYED_RELEASE_TABLET | Freq: Every day | ORAL | 5 refills | Status: AC

## 2024-03-10 NOTE — Telephone Encounter (Signed)
 Copied from CRM [id number]. Topic: Referral - Status >> Mar 10, 2024 11:15 AM Oddis Bench wrote: Reason for CRM: Patient is caslling back to inform the company for walker is [facility] [address] Jonathon Neighbors [phone] and the phone number is [phone]

## 2024-03-10 NOTE — Progress Notes (Signed)
 [address] [place] [address] Dept: [phone]  FOLLOW UP NOTE  Patient ID: Jennifer [name], female    DOB: 05-18-1966  Age: 58 y.o. MRN: [id number] Date of Office Visit: 03/10/2024  Assessment  Chief Complaint: Follow-up (Doing well on asthma allergy  no issues to report. Had cortisone in knees yesterday and rx'd muscle relaxers taken off work due to inflamation and pain)  HPI Jennifer [name] is a 58 year old female who presents today for follow-up or recurrent sinus infections and bronchitis, allergic rhinitis, and gastroesophageal reflux disease.  She was last seen on January 17, 2024 by myself.  Since her last office visit she has received steroid injections for both knees and will be off work for 12 weeks on [employer].  She is also being sent to a clinic to get therapy for the lymphedema in both her legs.  She has not had any surgery since we last saw her.  Recurrent sinus infections and bronchitis: She denies cough, wheeze, tightness in chest, shortness of breath, and nocturnal awakenings due to breathing problems.  She feels like her current regimen of Flovent  44 mcg 2 puffs twice a day and albuterol  as needed is working good.  Since her last office visit she has not required any trips to the emergency room or urgent care due to breathing problems.  The only steroid she has received was recently for her knees.  She reports rare use of albuterol .  She has not had any sinus infections or other infections since we last saw her.    Allergic rhinitis: She denies rhinorrhea, nasal congestion, postnasal drip.  She continues to use Flonase  nasal spray daily and carbinoxamine  twice a day.  She reports that this combination works good.  Gastroesophageal reflux disease.  She reports that she has been out of pantoprazole  and will just take a Tums as needed.  She reports that she will at least have symptoms a couple times a week.  It depends on her diet.She does report that her reflux was better  controlled on pantoprazole .   Drug Allergies:  Allergies  Allergen Reactions   Latex Other (See Comments) and Hives    Powder causes hives  hives    Review of Systems: Negative except as per HPI  Physical Exam: BP 126/80   Pulse 84   Temp 97.9 F (36.6 C) (Temporal)   Resp 18   Wt (!) 381 lb 4.8 oz (173 kg)   LMP 04/07/2017   SpO2 100%   BMI 74.47 kg/m    Physical Exam Constitutional:      Appearance: Normal appearance.  HENT:     Head: Normocephalic and atraumatic.     Right Ear: Tympanic membrane, ear canal and external ear normal.     Left Ear: Tympanic membrane and external ear normal.     Nose: Nose normal.     Mouth/Throat:     Mouth: Mucous membranes are moist.     Pharynx: Oropharynx is clear.  Eyes:     Conjunctiva/sclera: Conjunctivae normal.  Cardiovascular:     Rate and Rhythm: Regular rhythm.     Heart sounds: Normal heart sounds.  Pulmonary:     Effort: Pulmonary effort is normal.     Breath sounds: Normal breath sounds.     Comments: Lungs clear to auscultation Musculoskeletal:     Cervical back: Neck supple.  Skin:    General: Skin is warm.  Neurological:     Mental Status: She is alert and oriented  to person, place, and time.  Psychiatric:        Mood and Affect: Mood normal.        Behavior: Behavior normal.        Thought Content: Thought content normal.        Judgment: Judgment normal.     Diagnostics:  None  Assessment and Plan: 1. Mild persistent asthma without complication   2. Seasonal and perennial allergic rhinitis   3. Gastroesophageal reflux disease without esophagitis     Meds ordered this encounter  Medications   pantoprazole  (PROTONIX ) 40 MG tablet    Sig: Take 1 tablet (40 mg total) by mouth daily.    Dispense:  30 tablet    Refill:  5    Patient Instructions  Recurrent sinus infections and bronchitis Jennifer [name] experiences recurrent sinus infections and bronchitis, leading to multiple ER visits. Symptoms  include rhinorrhea, nasal congestion, epiphora, dry cough, and production of thick green sputum, worsening in fall and winter and improving in summer.  Breathing test at last office visit was normal - Continue flovent  44mcg 2 puffs twice daily  - Continue albuterol  2 puffs every 4-6 hours as needed for cough, wheeze, dyspnea   Allergic rhinitis Jennifer [name] presents with allergic rhinitis, characterized by rhinorrhea, nasal congestion, epiphora, and post-nasal drip. Previous allergy  testing indicated sensitivities to [place] grass, Cladosporium, dust mite, dog, [place], and Mucor. Current management includes cetirizine and fluticasone , but cetirizine is no longer effective. Immunotherapy is a potential treatment to retrain the immune system and potentially induce tolerance to allergens such as dog. - Skin testing on 01/24/24 was positive to meadow fescue and perennial rye with adequate controls. Intradermal skin testing was positive to dust mite mix, cat hair, and dog epithelia - Continue carbinoxamine  4mg  2-3 times per day - Continue flonase  1 spray per nostril twice daily  - Continue allergen avoidance measures as below - Consider immunotherapy to retrain the immune system and potentially induce tolerance to allergens.  Gastroesophageal reflux disease (GERD)  Past history: Jennifer [name]'s GERD is managed with pantoprazole  and over-the-counter calcium  carbonate. Symptoms are generally well-controlled, with pantoprazole  used as needed for severe symptoms.Interim history : worsening symptoms since off pantoprazole  - Restart pantoprazole  and calcium  carbonate as needed.  Follow up:6 months or sooner if needed  Reducing Pollen Exposure The American Academy of Allergy , Asthma and Immunology suggests the following steps to reduce your exposure to pollen during allergy  seasons. Do not hang sheets or clothing out to dry; pollen may collect on these items. Do not mow lawns or spend time around freshly cut grass;  mowing stirs up pollen. Keep windows closed at night.  Keep car windows closed while driving. Minimize morning activities outdoors, a time when pollen counts are usually at their highest. Stay indoors as much as possible when pollen counts or humidity is high and on windy days when pollen tends to remain in the air longer. Use air conditioning when possible.  Many air conditioners have filters that trap the pollen spores. Use a HEPA room air filter to remove pollen form the indoor air you breathe.  Control of Dust Mite Allergen Dust mites play a major role in allergic asthma and rhinitis. They occur in environments with high humidity wherever human skin is found. Dust mites absorb humidity from the atmosphere (ie, they do not drink) and feed on organic matter (including shed human and animal skin). Dust mites are a microscopic type of insect that you cannot see with the naked eye. High  levels of dust mites have been detected from mattresses, pillows, carpets, upholstered furniture, bed covers, clothes, soft toys and any woven material. The principal allergen of the dust mite is found in its feces. A gram of dust may contain 1,000 mites and 250,000 fecal particles. Mite antigen is easily measured in the air during house cleaning activities. Dust mites do not bite and do not cause harm to humans, other than by triggering allergies/asthma.  Ways to decrease your exposure to dust mites in your home:  1. Encase mattresses, box springs and pillows with a mite-impermeable barrier or cover  2. Wash sheets, blankets and drapes weekly in hot water (130 F) with detergent and dry them in a dryer on the hot setting.  3. Have the room cleaned frequently with a vacuum cleaner and a damp dust-mop. For carpeting or rugs, vacuuming with a vacuum cleaner equipped with a high-efficiency particulate air (HEPA) filter. The dust mite allergic individual should not be in a room which is being cleaned and should wait 1 hour  after cleaning before going into the room.  4. Do not sleep on upholstered furniture (eg, couches).  5. If possible removing carpeting, upholstered furniture and drapery from the home is ideal. Horizontal blinds should be eliminated in the rooms where the person spends the most time (bedroom, study, television room). Washable vinyl, roller-type shades are optimal.  6. Remove all non-washable stuffed toys from the bedroom. Wash stuffed toys weekly like sheets and blankets above.  7. Reduce indoor humidity to less than 50%. Inexpensive humidity monitors can be purchased at most hardware stores. Do not use a humidifier as can make the problem worse and are not recommended.   Control of Dog or Cat Allergen Avoidance is the best way to manage a dog or cat allergy . If you have a dog or cat and are allergic to dog or cats, consider removing the dog or cat from the home. If you have a dog or cat but don't want to find it a new home, or if your family wants a pet even though someone in the household is allergic, here are some strategies that may help keep symptoms at bay:  Keep the pet out of your bedroom and restrict it to only a few rooms. Be advised that keeping the dog or cat in only one room will not limit the allergens to that room. Don't pet, hug or kiss the dog or cat; if you do, wash your hands with soap and water. High-efficiency particulate air (HEPA) cleaners run continuously in a bedroom or living room can reduce allergen levels over time. Regular use of a high-efficiency vacuum cleaner or a central vacuum can reduce allergen levels. Giving your dog or cat a bath at least once a week can reduce airborne allergen. Return in about 6 months (around 09/10/2024), or if symptoms worsen or fail to improve.    Thank you for the opportunity to care for this patient.  Please do not hesitate to contact me with questions.  Tinnie Forehand, FNP [facility] 

## 2024-03-14 ENCOUNTER — Telehealth: Payer: Self-pay | Admitting: Nurse Practitioner

## 2024-03-14 NOTE — Telephone Encounter (Signed)
 Copied from CRM [id number]. Topic: Referral - Status >> Mar 10, 2024 11:15 AM Oddis Bench wrote: Reason for CRM: Patient is caslling back to inform the company for walker is [facility] [address] [place],[id number] and the phone number is [phone] >> Mar 14, 2024  4:06 PM Fredrica W wrote: Patient called back to check status of this request. Would like a callback with updated. States her 3rd or 4th call. Attempted to reach CAL - no answer. Thank You

## 2024-03-15 ENCOUNTER — Telehealth: Payer: Self-pay | Admitting: Nurse Practitioner

## 2024-03-15 NOTE — Telephone Encounter (Signed)
 Sent to [facility]

## 2024-03-15 NOTE — Telephone Encounter (Signed)
 Copied from CRM [id number]. Topic: Clinical - Medication Question >> Mar 15, 2024 12:26 PM Georgeann Kindred wrote: Reason for CRM: Patient called to check on the status of her Rollator walker that was to be sent in to [facility] in [place], [place] [address], [place], [place]. Phone [phone] Fax: [phone].

## 2024-03-16 ENCOUNTER — Telehealth: Payer: Self-pay

## 2024-03-16 ENCOUNTER — Telehealth: Payer: Self-pay | Admitting: Nurse Practitioner

## 2024-03-16 NOTE — Telephone Encounter (Signed)
 Copied from CRM [id number]. Topic: Clinical - Medication Question >> Mar 15, 2024 12:26 PM Georgeann Kindred wrote: Reason for CRM: Patient called to check on the status of her Rollator walker that was to be sent in to [facility] in [place], [place] [address], [place], [place]. Phone [phone] Fax: [phone]. >> Mar 15, 2024  3:35 PM Felizardo Hotter wrote: Patient calling again to check on the status of her Rollator walker that needs to be sent [facility] in [place], [place] [address] [place] [address], [place], [place]. Phone [phone] Fax: [phone].Pt would like a call back at [phone]

## 2024-03-16 NOTE — Telephone Encounter (Signed)
 Copied from CRM [id number]. Topic: Referral - Status >> Mar 10, 2024 11:15 AM Oddis Bench wrote: Reason for CRM: Patient is caslling back to inform the company for walker is [facility] [address] [place],[address] and the phone number is [phone] >> Mar 16, 2024  1:53 PM Lizabeth Riggs wrote: [place] is calling back about order for walker. Please call her back today with an update. Her number is [phone].  I tried to transfer to clinic but no answer. Thanks >> Mar 14, 2024  4:06 PM Crispin Dolphin wrote: Patient called back to check status of this request. Would like a callback with updated. States her 3rd or 4th call. Attempted to reach CAL - no answer. Thank You

## 2024-04-07 ENCOUNTER — Encounter: Payer: Self-pay | Admitting: Nurse Practitioner

## 2024-04-07 ENCOUNTER — Telehealth: Payer: Self-pay | Admitting: Nurse Practitioner

## 2024-04-07 NOTE — Telephone Encounter (Signed)
 Copied from CRM [id number]. Topic: Appointments - Appointment Cancel/Reschedule >> Apr 06, 2024 10:40 AM El Gravely T wrote: Patient/patient representative is calling to cancel or reschedule an appointment. Refer to attachments for appointment information.   Patient called to cancel appointment scheduled on 04/07/24.  Per patient request, appointment cancelled.  Patient also inquiring on referral for Lymphedema.   Per referral information in chart, patient given referring office contact information. Per patient to reach out to office, [facility] [phone] Jayson Michael [phone], to schedule appointment.  No further assistance needed at this time. >> Apr 07, 2024 11:22 AM Lizabeth Riggs wrote: [facility] did not receive this referral. Please resend this referral. Thanks [facility] [address] [place] [address]  P:  [phone] F:  [phone]

## 2024-04-18 ENCOUNTER — Other Ambulatory Visit: Payer: Self-pay | Admitting: Nurse Practitioner

## 2024-04-18 ENCOUNTER — Telehealth: Payer: Self-pay

## 2024-04-18 DIAGNOSIS — I890 Lymphedema, not elsewhere classified: Secondary | ICD-10-CM

## 2024-04-18 NOTE — Telephone Encounter (Signed)
 Copied from CRM [id number]. Topic: Referral - Question >> Apr 18, 2024  1:10 PM Jennifer [name] wrote: Reason for CRM:   Referral for lymphedema special.  The one that she was referred to is out of network.  She is wanting to go [facility]

## 2024-04-19 NOTE — Telephone Encounter (Signed)
 Referral faxed to [facility] and MyChart message sent to patient.  Previous referral cancelled.

## 2024-04-25 ENCOUNTER — Emergency Department (HOSPITAL_BASED_OUTPATIENT_CLINIC_OR_DEPARTMENT_OTHER)

## 2024-04-25 ENCOUNTER — Other Ambulatory Visit: Payer: Self-pay

## 2024-04-25 ENCOUNTER — Emergency Department (HOSPITAL_BASED_OUTPATIENT_CLINIC_OR_DEPARTMENT_OTHER): Admission: EM | Admit: 2024-04-25 | Discharge: 2024-04-25 | Disposition: A | Discharge status: Home / Self Care

## 2024-04-25 ENCOUNTER — Encounter (HOSPITAL_BASED_OUTPATIENT_CLINIC_OR_DEPARTMENT_OTHER): Payer: Self-pay | Admitting: Emergency Medicine

## 2024-04-25 DIAGNOSIS — I10 Essential (primary) hypertension: Secondary | ICD-10-CM | POA: Diagnosis not present

## 2024-04-25 DIAGNOSIS — R600 Localized edema: Secondary | ICD-10-CM | POA: Diagnosis present

## 2024-04-25 DIAGNOSIS — R0789 Other chest pain: Secondary | ICD-10-CM | POA: Insufficient documentation

## 2024-04-25 DIAGNOSIS — Z79899 Other long term (current) drug therapy: Secondary | ICD-10-CM | POA: Insufficient documentation

## 2024-04-25 DIAGNOSIS — Z91040 Latex allergy status: Secondary | ICD-10-CM | POA: Diagnosis not present

## 2024-04-25 DIAGNOSIS — R7989 Other specified abnormal findings of blood chemistry: Secondary | ICD-10-CM | POA: Diagnosis not present

## 2024-04-25 LAB — CBC
HCT: 35 % — ABNORMAL LOW (ref 36.0–46.0)
Hemoglobin: 11.5 g/dL — ABNORMAL LOW (ref 12.0–15.0)
MCH: 30.2 pg (ref 26.0–34.0)
MCHC: 32.9 g/dL (ref 30.0–36.0)
MCV: 91.9 fL (ref 80.0–100.0)
Platelets: 204 K/uL (ref 150–400)
RBC: 3.81 MIL/uL — ABNORMAL LOW (ref 3.87–5.11)
RDW: 14.5 % (ref 11.5–15.5)
WBC: 7.1 K/uL (ref 4.0–10.5)
nRBC: 0 % (ref 0.0–0.2)

## 2024-04-25 LAB — BASIC METABOLIC PANEL WITH GFR
Anion gap: 12 (ref 5–15)
BUN: 16 mg/dL (ref 6–20)
CO2: 21 mmol/L — ABNORMAL LOW (ref 22–32)
Calcium: 8.9 mg/dL (ref 8.9–10.3)
Chloride: 105 mmol/L (ref 98–111)
Creatinine, Ser: 0.75 mg/dL (ref 0.44–1.00)
GFR, Estimated: >60 mL/min (ref >60)
Glucose, Bld: 105 mg/dL — ABNORMAL HIGH (ref 70–99)
Potassium: 4.1 mmol/L (ref 3.5–5.1)
Sodium: 138 mmol/L (ref 135–145)

## 2024-04-25 LAB — PRO BRAIN NATRIURETIC PEPTIDE: Pro Brain Natriuretic Peptide: 373 pg/mL — ABNORMAL HIGH (ref <300.0)

## 2024-04-25 LAB — TROPONIN T, HIGH SENSITIVITY: Troponin T High Sensitivity: <15 ng/L (ref <19)

## 2024-04-25 MED ORDER — FUROSEMIDE 10 MG/ML IJ SOLN
40.0000 mg | Freq: Once | INTRAMUSCULAR | Status: AC
  Administered 2024-04-25: 40 mg via INTRAVENOUS
  Filled 2024-04-25: qty 4

## 2024-04-25 MED ORDER — FUROSEMIDE 20 MG PO TABS: 20.0000 mg | ORAL_TABLET | Freq: Every day | ORAL | 1 refills | Status: DC

## 2024-04-25 NOTE — Discharge Instructions (Signed)
 I would take a multivitamin to make sure that you have enough potassium or eat some green leafy vegetables to help make sure that you are having adequate potassium in your diet.  You should receive a call from the cardiologist within the next few days to schedule follow-up appointment.  Begin taking the fluid pill that I prescribed once daily.  Please return if you have significant worsening chest pain, shortness of breath despite treatment.

## 2024-04-25 NOTE — ED Triage Notes (Signed)
 Pt POV c/o intermittent chest tightness starting this AM after water aerobics.  Denies emesis/diaphoresis. Also reports BLLE swelling, left worse than R.   Hx of lymphedema.

## 2024-04-25 NOTE — ED Notes (Addendum)
 Patient transported to x-ray. ?

## 2024-04-25 NOTE — ED Notes (Signed)
 Pt requesting ice chips. OK per RN Tawni. Pt given the same.

## 2024-04-25 NOTE — ED Provider Notes (Signed)
 [place] [facility] AT [facility] Provider Note   CSN: [id number] Arrival date & time: 04/25/24  1752     Patient presents with: Chest Pain and Leg Swelling   Jennifer [name] is a 58 y.o. female past medical history significant for hypertension, hyperlipidemia, morbid obesity, lymphedema, chronic pain who presents with concern for bilateral leg edema worse than baseline, left worse than right.  Denies any history of blood clots but reports she has not been evaluated in the past for this condition.  Reports some chest tightness that started this morning after water aerobics.  Denies nausea, vomiting, sweating, reports that the chest tightness/pain is resolved at this time.    Chest Pain      Prior to Admission medications   Medication Sig Start Date End Date Taking? Authorizing Provider  furosemide  (LASIX ) 20 MG tablet Take 1 tablet (20 mg total) by mouth daily. 04/25/24  Yes Machell Wirthlin H, PA-C  acetaminophen  (TYLENOL ) 500 MG tablet Frequency:   Dosage:0   MG  Instructions:  Note:1 tab by mouth as needed for pain every 4 hours (Tylenol ),take with food. 07/14/13   [provider]  albuterol  (PROVENTIL ) (5 MG/ML) 0.5% nebulizer solution Take 0.5 mLs (2.5 mg total) by nebulization every 6 (six) hours as needed for wheezing or shortness of breath. 12/05/23   Smoot, Lauraine LABOR, PA-C  atorvastatin  (LIPITOR) 10 MG tablet Take 1 tablet (10 mg total) by mouth daily. 12/08/23   Paseda, Folashade R, FNP  Carbinoxamine  Maleate 4 MG TABS Take 1 tablet (4 mg total) by mouth in the morning, at noon, and at bedtime. 01/17/24   Lorin Norris, MD  famotidine  (PEPCID ) 20 MG tablet Take 1 tablet (20 mg total) by mouth 2 (two) times daily. 03/05/22   Elnor Bernarda SQUIBB, DO  fluticasone  (FLONASE ) 50 MCG/ACT nasal spray Place 1 spray into both nostrils 2 (two) times daily. 12/08/23   Paseda, Folashade R, FNP  fluticasone  (FLOVENT  HFA) 44 MCG/ACT inhaler Inhale 2 puffs into the lungs 2  (two) times daily. 01/17/24   Lorin Norris, MD  hydrochlorothiazide  (HYDRODIURIL ) 12.5 MG tablet Take 1 tablet (12.5 mg total) by mouth daily. 12/08/23   Paseda, Folashade R, FNP  ibuprofen  (ADVIL ) 800 MG tablet Take 1 tablet (800 mg total) by mouth every 8 (eight) hours as needed for moderate pain (pain score 4-6). 03/01/24   Paseda, Folashade R, FNP  meclizine  (ANTIVERT ) 25 MG tablet Take 1 tablet (25 mg total) by mouth 3 (three) times daily as needed for dizziness. 05/14/17   Mesner, Selinda, MD  metFORMIN  (GLUCOPHAGE ) 500 MG tablet Take 1 tablet (500 mg total) by mouth 2 (two) times daily with a meal. Patient not taking: Reported on 03/01/2024 12/08/23 03/07/24  Paseda, Folashade R, FNP  methocarbamol  (ROBAXIN ) 500 MG tablet Take 2 tablets (1,000 mg total) by mouth every 8 (eight) hours as needed for muscle spasms. 03/01/24   Paseda, Folashade R, FNP  Misc. Devices (ROLLATOR ULTRA-LIGHT) MISC 1 each by Does not apply route as needed. 03/09/24   Paseda, Folashade R, FNP  pantoprazole  (PROTONIX ) 40 MG tablet Take 1 tablet (40 mg total) by mouth daily. 03/10/24   Cheryl Reusing, FNP  Vitamin D , Ergocalciferol , (DRISDOL) 1.25 MG (50000 UNIT) CAPS capsule Take 50,000 Units by mouth once a week. Patient not taking: Reported on 03/10/2024    [provider]    Allergies: Latex    Review of Systems  Cardiovascular:  Positive for chest pain.  All other systems  reviewed and are negative.   Updated Vital Signs BP 137/69   Pulse 84   Temp 98.4 F (36.9 C) (Oral)   Resp (!) 21   Ht 5' (1.524 m)   Wt (!) 168.7 kg   LMP 04/07/2017   SpO2 100%   BMI 72.65 kg/m   Physical Exam Vitals and nursing note reviewed.  Constitutional:      General: She is not in acute distress.    Appearance: Normal appearance. She is obese.  HENT:     Head: Normocephalic and atraumatic.  Eyes:     General:        Right eye: No discharge.        Left eye: No discharge.  Cardiovascular:     Rate and Rhythm:  Normal rate and regular rhythm.     Heart sounds: No murmur heard.    No friction rub. No gallop.  Pulmonary:     Effort: Pulmonary effort is normal.     Breath sounds: Normal breath sounds.  Abdominal:     General: Bowel sounds are normal.     Palpations: Abdomen is soft.  Musculoskeletal:     Comments: Bilateral lower extremity edema, left slightly worse than right  Skin:    General: Skin is warm and dry.     Capillary Refill: Capillary refill takes less than 2 seconds.  Neurological:     Mental Status: She is alert and oriented to person, place, and time.  Psychiatric:        Mood and Affect: Mood normal.        Behavior: Behavior normal.     (all labs ordered are listed, but only abnormal results are displayed) Labs Reviewed  BASIC METABOLIC PANEL WITH GFR - Abnormal; Notable for the following components:      Result Value   CO2 21 (*)    Glucose, Bld 105 (*)    All other components within normal limits  CBC - Abnormal; Notable for the following components:   RBC 3.81 (*)    Hemoglobin 11.5 (*)    HCT 35.0 (*)    All other components within normal limits  PRO BRAIN NATRIURETIC PEPTIDE - Abnormal; Notable for the following components:   Pro Brain Natriuretic Peptide 373.0 (*)    All other components within normal limits  TROPONIN T, HIGH SENSITIVITY    EKG: EKG Interpretation Date/Time:  Tuesday April 25 2024 18:02:11 EDT Ventricular Rate:  83 PR Interval:  139 QRS Duration:  84 QT Interval:  386 QTC Calculation: 454 R Axis:   13  Text Interpretation: Sinus rhythm Low voltage, precordial leads Confirmed by Simon Rea [phone]) on 04/25/2024 7:59:09 PM  Radiology: US  Venous Img Lower  Left (DVT Study) Result Date: 04/25/2024 CLINICAL DATA:  Unilateral leg swelling EXAM: Left LOWER EXTREMITY VENOUS DOPPLER ULTRASOUND TECHNIQUE: Gray-scale sonography with compression, as well as color and duplex ultrasound, were performed to evaluate the deep venous system(s) from  the level of the common femoral vein through the popliteal and proximal calf veins. COMPARISON:  None Available. FINDINGS: VENOUS Normal compressibility of the common femoral, superficial femoral, and popliteal veins, as well as the visualized calf veins. Visualized portions of profunda femoral vein and great saphenous vein unremarkable. Calf veins are poorly visible. Peroneal vein not well seen. No filling defects to suggest DVT on grayscale or color Doppler imaging. Doppler waveforms show normal direction of venous flow, normal respiratory plasticity and response to augmentation. Limited views of the contralateral common  femoral vein are unremarkable. OTHER None. Limitations: Body habitus IMPRESSION: Negative. Electronically Signed   By: Luke Bun M.D.   On: 04/25/2024 19:25   DG Chest 2 View Result Date: 04/25/2024 CLINICAL DATA:  Chest pain. EXAM: CHEST - 2 VIEW COMPARISON:  Chest radiograph dated 12/05/2023. FINDINGS: No focal consolidation, pleural effusion, pneumothorax. Mild cardiomegaly. No acute osseous pathology. IMPRESSION: 1. No active cardiopulmonary disease. 2. Mild cardiomegaly. Electronically Signed   By: Vanetta Chou M.D.   On: 04/25/2024 18:47     Procedures   Medications Ordered in the ED  furosemide  (LASIX ) injection 40 mg (40 mg Intravenous Given 04/25/24 1902)                                    Medical Decision Making Amount and/or Complexity of Data Reviewed Labs: ordered. Radiology: ordered.  Risk Prescription drug management.   This patient is a 58 y.o. female  who presents to the ED for concern of chest tightness, leg swelling.   Differential diagnoses prior to evaluation: The emergent differential diagnosis includes, but is not limited to,  ACS, AAS, PE, Mallory-Weiss, Boerhaave's, Pneumonia, acute bronchitis, asthma or COPD exacerbation, anxiety, MSK pain or traumatic injury to the chest, acid reflux versus other, DVT, peripheral edema, Achilles tendon  rupture, calf muscle strain, calf muscle rupture, cellulitis, abscess, PAD, PVD, versus other musculoskeletal injury . This is not an exhaustive differential.   Past Medical History / Co-morbidities / Social History: hypertension, hyperlipidemia, morbid obesity, lymphedema, chronic pain  Additional history: Chart reviewed. Pertinent results include: Reviewed outpatient family medicine visits, lab work, imaging  Physical Exam: Physical exam performed. The pertinent findings include: Vital signs stable in the emergency department other than some hypertension on arrival, blood pressure 143/73, normotensive at time of discharge, 137/69, very mild tachypnea at time of discharge, respirations 21, she is morbidly obese. Bilateral lower extremity edema, left slightly worse than right  Lab Tests/Imaging studies: I personally interpreted labs/imaging and the pertinent results include: BMP with very mild bicarb deficit, CO2 21, troponin negative x 1 in context of some generalized chest tightness, chest pain-free at time of evaluation, chest tightness 6 hours prior to arrival.  Her proBNP is elevated at 373, increasing suspicion for some developing heart failure/fluid overload.  CBC with very mild anemia, hemoglobin 11.5, otherwise unremarkable.  DVT study of the left negative for blood clot slightly limited to body habitus, she reports chronically left greater than right, overall low clinical suspicion for acute DVT at this time given that context.  Plain, chest x-ray with cardiomegaly without overt pulmonary congestion or pleural effusion, no infiltrate noted. I agree with the radiologist interpretation.  Cardiac monitoring: EKG obtained and interpreted by myself and attending physician which shows: NSR, no acute ST-T changes   Medications: I ordered medication including Lasix  for fluid overload, will plan to discharge with short course of Lasix  20 mg once daily, and ambulatory referral to cardiology placed  to evaluate for developing heart failure.  No tachypnea, no hypoxia, I think otherwise stable for discharge at this time.  I have reviewed the patients home medicines and have made adjustments as needed.   Disposition: After consideration of the diagnostic results and the patients response to treatment, I feel that patient stable for discharge with plan as above, close cardiology follow-up.   emergency department workup does not suggest an emergent condition requiring admission or immediate intervention beyond  what has been performed at this time. The plan is: as above. The patient is safe for discharge and has been instructed to return immediately for worsening symptoms, change in symptoms or any other concerns.    Final diagnoses:  Peripheral edema  Elevated brain natriuretic peptide (BNP) level  Chest tightness    ED Discharge Orders          Ordered    furosemide  (LASIX ) 20 MG tablet  Daily        04/25/24 2033    Ambulatory referral to Cardiology        04/25/24 2050               Rosan Sherlean DEL, PA-C 04/25/24 2050    Simon Lavonia SAILOR, MD 04/25/24 2135

## 2024-05-02 ENCOUNTER — Encounter: Payer: Self-pay | Admitting: Nurse Practitioner

## 2024-05-02 ENCOUNTER — Ambulatory Visit (INDEPENDENT_AMBULATORY_CARE_PROVIDER_SITE_OTHER): Payer: Self-pay | Admitting: Nurse Practitioner

## 2024-05-02 ENCOUNTER — Other Ambulatory Visit: Payer: Self-pay

## 2024-05-02 ENCOUNTER — Telehealth: Payer: Self-pay

## 2024-05-02 VITALS — BP 103/63 | HR 82 | Temp 97.0°F

## 2024-05-02 DIAGNOSIS — R600 Localized edema: Secondary | ICD-10-CM

## 2024-05-02 DIAGNOSIS — R7989 Other specified abnormal findings of blood chemistry: Secondary | ICD-10-CM | POA: Diagnosis not present

## 2024-05-02 DIAGNOSIS — I10 Essential (primary) hypertension: Secondary | ICD-10-CM

## 2024-05-02 DIAGNOSIS — M170 Bilateral primary osteoarthritis of knee: Secondary | ICD-10-CM | POA: Diagnosis not present

## 2024-05-02 DIAGNOSIS — Z09 Encounter for follow-up examination after completed treatment for conditions other than malignant neoplasm: Secondary | ICD-10-CM

## 2024-05-02 MED ORDER — IBUPROFEN 800 MG PO TABS: 800.0000 mg | ORAL_TABLET | Freq: Three times a day (TID) | ORAL | 1 refills | Status: DC | PRN

## 2024-05-02 MED ORDER — FUROSEMIDE 20 MG PO TABS: 20.0000 mg | ORAL_TABLET | Freq: Every day | ORAL | 3 refills | Status: DC

## 2024-05-02 MED ORDER — KETOROLAC TROMETHAMINE 30 MG/ML IJ SOLN
30.0000 mg | Freq: Once | INTRAMUSCULAR | Status: AC
  Administered 2024-05-02: 30 mg via INTRAMUSCULAR

## 2024-05-02 MED ORDER — TIRZEPATIDE-WEIGHT MANAGEMENT 2.5 MG/0.5ML ~~LOC~~ SOLN: 2.5000 mg | SUBCUTANEOUS | 0 refills | Status: DC

## 2024-05-02 NOTE — Assessment & Plan Note (Signed)
 DASH diet and commitment to daily physical activity for a minimum of 30 minutes discussed and encouraged, as a part of hypertension management. The importance of attaining a healthy weight is also discussed Continue hydrochlorothiazide  12.5 mg daily     05/02/2024    1:52 PM 04/25/2024    8:00 PM 04/25/2024    7:15 PM 04/25/2024    6:30 PM 04/25/2024    6:03 PM 04/25/2024    5:58 PM 03/10/2024    8:43 AM  BP/Weight  Systolic BP 103 137 148 153 143  126  Diastolic BP 63 69 84 79 73  80  Wt. (Lbs)      372 381.3  BMI      72.65 kg/m2 74.47 kg/m2

## 2024-05-02 NOTE — Assessment & Plan Note (Signed)
 Continue furosemide  20 mg daily, take with OTC potassium supplement Checking BMP

## 2024-05-02 NOTE — Assessment & Plan Note (Signed)
 Follow-up with clinic as planned Continue hydrochlorothiazide  and furosemide  Continue use of compression socks, DASH diet and elevation advised

## 2024-05-02 NOTE — Progress Notes (Signed)
 Established Patient Office Visit  Subjective:  Patient ID: Jennifer [name], female    DOB: 05-Dec-1965  Age: 58 y.o. MRN: [id number]  CC:  Chief Complaint  Patient presents with   Hospitalization Follow-up    Chest pain and left leg swelling    HPI Jennifer [name] is a 58 y.o. female  has a past medical history of Arthritis, Eczema, GERD (gastroesophageal reflux disease), High cholesterol, Hypertension, Obesity, Seasonal allergies, and Vitamin D  deficiency.  Patient presents for hospitalization follow-up   Peripheral edema, elevated BNP Patient was at the emergency department on 04/27/2024 for complaints of bilateral leg edema worse with baseline some chest tightness.  BNP was found to be elevated at 373 due to suspicion for developing heart failure patient was started on furosemide  20 mg daily, taking hydrochlorothiazide  12.5 mg daily for high blood pressure, stated that she was told to take OTC potassium supplement with furosemide .  Chest x-ray showed cardiomegaly without overt pulmonary congestion, she has made an appointment with cardiology , sees them in October , wears compression socks at home .  She currently denies chest pain, shortness of breath, stated that her lower extremity edema has improved, goes to the lymphedema clinic on Thursday  Left knee pain patient complains of chronic left knee pain,currently rated 9/10, she is followed by orthopedics and gets steroid injections ,takes ibuprofen  Tylenol  at home, also uses Voltaren gel.  Obesity.  She was on Mounjaro  in the past but stopped taking the medication because her insurance did not cover the medication, she would like to restart a GLP-1 to assist with weight loss.  She is followed by bariatric surgery at [facility].   Past Medical History:  Diagnosis Date   Arthritis    Eczema    GERD (gastroesophageal reflux disease)    High cholesterol    Hypertension    Obesity    Seasonal allergies    Vitamin D  deficiency      Past Surgical History:  Procedure Laterality Date   CESAREAN SECTION     TONSILLECTOMY     TUBAL LIGATION      Family History  Problem Relation Age of Onset   Hepatitis B Mother    High blood pressure Mother    GER disease Father    High blood pressure Father    Allergic rhinitis Sister    Allergic rhinitis Sister    Sinusitis Sister    Colon cancer Daughter    Angioedema Neg Hx    Eczema Neg Hx    Urticaria Neg Hx    Immunodeficiency Neg Hx     Social History   Socioeconomic History   Marital status: Divorced    Spouse name: Not on file   Number of children: 1   Years of education: Not on file   Highest education level: Some college, no degree  Occupational History   Not on file  Tobacco Use   Smoking status: Never   Smokeless tobacco: Never  Vaping Use   Vaping status: Never Used  Substance and Sexual Activity   Alcohol use: No   Drug use: No   Sexual activity: Yes    Birth control/protection: None, Surgical  Other Topics Concern   Not on file  Social History Narrative   Lives with grandchildren.    Social Drivers of Corporate investment banker Strain: High Risk (02/29/2024)   Overall Financial Resource Strain (CARDIA)    Difficulty of Paying Living Expenses: Very hard  Food Insecurity: Food  Insecurity Present (02/29/2024)   Hunger Vital Sign    Worried About Running Out of Food in the Last Year: Often true    Ran Out of Food in the Last Year: Never true  Transportation Needs: No Transportation Needs (02/29/2024)   PRAPARE - Administrator, Civil Service (Medical): No    Lack of Transportation (Non-Medical): No  Physical Activity: Unknown (02/29/2024)   Exercise Vital Sign    Days of Exercise per Week: 0 days    Minutes of Exercise per Session: Not on file  Stress: No Stress Concern Present (02/29/2024)   [employer] of Occupational Health - Occupational Stress Questionnaire    Feeling of Stress : Not at all  Social Connections:  Moderately Isolated (02/29/2024)   Social Connection and Isolation Panel    Frequency of Communication with Friends and Family: More than three times a week    Frequency of Social Gatherings with Friends and Family: Twice a week    Attends Religious Services: More than 4 times per year    Active Member of [employer] or Organizations: No    Attends Engineer, structural: Not on file    Marital Status: Separated  Intimate Partner Violence: Not At Risk (12/09/2023)   Humiliation, Afraid, Rape, and Kick questionnaire    Fear of Current or Ex-Partner: No    Emotionally Abused: No    Physically Abused: No    Sexually Abused: No    Outpatient Medications Prior to Visit  Medication Sig Dispense Refill   acetaminophen  (TYLENOL ) 500 MG tablet Frequency:   Dosage:0   MG  Instructions:  Note:1 tab by mouth as needed for pain every 4 hours (Tylenol ),take with food.     albuterol  (PROVENTIL ) (5 MG/ML) 0.5% nebulizer solution Take 0.5 mLs (2.5 mg total) by nebulization every 6 (six) hours as needed for wheezing or shortness of breath. 20 mL 12   atorvastatin  (LIPITOR) 10 MG tablet Take 1 tablet (10 mg total) by mouth daily. 90 tablet 1   Carbinoxamine  Maleate 4 MG TABS Take 1 tablet (4 mg total) by mouth in the morning, at noon, and at bedtime. 90 tablet 5   famotidine  (PEPCID ) 20 MG tablet Take 1 tablet (20 mg total) by mouth 2 (two) times daily. 30 tablet 0   fluticasone  (FLONASE ) 50 MCG/ACT nasal spray Place 1 spray into both nostrils 2 (two) times daily. 15.8 mL 3   fluticasone  (FLOVENT  HFA) 44 MCG/ACT inhaler Inhale 2 puffs into the lungs 2 (two) times daily. 1 each 12   hydrochlorothiazide  (HYDRODIURIL ) 12.5 MG tablet Take 1 tablet (12.5 mg total) by mouth daily. 90 tablet 1   methocarbamol  (ROBAXIN ) 500 MG tablet Take 2 tablets (1,000 mg total) by mouth every 8 (eight) hours as needed for muscle spasms. 60 tablet 0   Misc. Devices (ROLLATOR ULTRA-LIGHT) MISC 1 each by Does not apply route as  needed. 1 each EACH   pantoprazole  (PROTONIX ) 40 MG tablet Take 1 tablet (40 mg total) by mouth daily. 30 tablet 5   Potassium Chloride CRYS by Does not apply route.     Vitamin D , Ergocalciferol , (DRISDOL) 1.25 MG (50000 UNIT) CAPS capsule Take 50,000 Units by mouth once a week.     furosemide  (LASIX ) 20 MG tablet Take 1 tablet (20 mg total) by mouth daily. 30 tablet 1   ibuprofen  (ADVIL ) 800 MG tablet Take 1 tablet (800 mg total) by mouth every 8 (eight) hours as needed for moderate pain (pain score  4-6). 30 tablet 1   meclizine  (ANTIVERT ) 25 MG tablet Take 1 tablet (25 mg total) by mouth 3 (three) times daily as needed for dizziness. (Patient not taking: Reported on 05/02/2024) 30 tablet 0   metFORMIN  (GLUCOPHAGE ) 500 MG tablet Take 1 tablet (500 mg total) by mouth 2 (two) times daily with a meal. (Patient not taking: Reported on 05/02/2024) 180 tablet 2   Facility-Administered Medications Prior to Visit  Medication Dose Route Frequency Provider Last Rate Last Admin   triamcinolone  ointment (KENALOG ) 0.1 %   Topical BID Iva Marty Saltness, MD        Allergies  Allergen Reactions   Latex Other (See Comments) and Hives    Powder causes hives  hives    ROS Review of Systems    Objective:    Physical Exam Vitals and nursing note reviewed.  Constitutional:      General: She is not in acute distress.    Appearance: Normal appearance. She is obese. She is not ill-appearing, toxic-appearing or diaphoretic.  Eyes:     General: No scleral icterus.       Right eye: No discharge.        Left eye: No discharge.     Extraocular Movements: Extraocular movements intact.     Conjunctiva/sclera: Conjunctivae normal.  Cardiovascular:     Rate and Rhythm: Normal rate and regular rhythm.     Pulses: Normal pulses.     Heart sounds: Normal heart sounds. No murmur heard.    No friction rub. No gallop.  Pulmonary:     Effort: Pulmonary effort is normal. No respiratory distress.     Breath  sounds: Normal breath sounds. No stridor. No wheezing, rhonchi or rales.  Chest:     Chest wall: No tenderness.  Abdominal:     General: There is no distension.     Palpations: Abdomen is soft.     Tenderness: There is no abdominal tenderness. There is no right CVA tenderness, left CVA tenderness or guarding.  Musculoskeletal:        General: Tenderness present. No swelling, deformity or signs of injury.     Right lower leg: Edema present.     Left lower leg: Edema present.     Comments: Nonpitting edema bilateral lower  Tenderness on palpation of left knee, patient sitting comfortably in a wheelchair  Skin:    General: Skin is warm and dry.     Capillary Refill: Capillary refill takes less than 2 seconds.     Coloration: Skin is not jaundiced or pale.     Findings: No bruising, erythema or lesion.  Neurological:     Mental Status: She is alert and oriented to person, place, and time.     Motor: No weakness.     Gait: Gait normal.  Psychiatric:        Mood and Affect: Mood normal.        Behavior: Behavior normal.        Thought Content: Thought content normal.        Judgment: Judgment normal.     BP 103/63   Pulse 82   Temp (!) 97 F (36.1 C)   LMP 04/07/2017   SpO2 98%  Wt Readings from Last 3 Encounters:  04/25/24 (!) 372 lb (168.7 kg)  03/10/24 (!) 381 lb 4.8 oz (173 kg)  03/01/24 (!) 380 lb (172.4 kg)    No results found for: TSH Lab Results  Component Value Date  WBC 7.1 04/25/2024   HGB 11.5 (L) 04/25/2024   HCT 35.0 (L) 04/25/2024   MCV 91.9 04/25/2024   PLT 204 04/25/2024   Lab Results  Component Value Date   NA 138 04/25/2024   K 4.1 04/25/2024   CO2 21 (L) 04/25/2024   GLUCOSE 105 (H) 04/25/2024   BUN 16 04/25/2024   CREATININE 0.75 04/25/2024   BILITOT 0.9 12/05/2023   ALKPHOS 75 12/05/2023   AST 18 12/05/2023   ALT 16 12/05/2023   PROT 7.2 12/05/2023   ALBUMIN 3.1 (L) 12/05/2023   CALCIUM  8.9 04/25/2024   ANIONGAP 12 04/25/2024    EGFR 88 03/01/2024   Lab Results  Component Value Date   CHOL 172 01/15/2023   Lab Results  Component Value Date   HDL 52 01/15/2023   Lab Results  Component Value Date   LDLCALC 108 (H) 01/15/2023   Lab Results  Component Value Date   TRIG 64 01/15/2023   Lab Results  Component Value Date   CHOLHDL 3.3 01/15/2023   No results found for: HGBA1C    Assessment & Plan:   Problem List Items Addressed This Visit       Cardiovascular and Mediastinum   Hypertension, essential   DASH diet and commitment to daily physical activity for a minimum of 30 minutes discussed and encouraged, as a part of hypertension management. The importance of attaining a healthy weight is also discussed Continue hydrochlorothiazide  12.5 mg daily     05/02/2024    1:52 PM 04/25/2024    8:00 PM 04/25/2024    7:15 PM 04/25/2024    6:30 PM 04/25/2024    6:03 PM 04/25/2024    5:58 PM 03/10/2024    8:43 AM  BP/Weight  Systolic BP 103 137 148 153 143  126  Diastolic BP 63 69 84 79 73  80  Wt. (Lbs)      372 381.3  BMI      72.65 kg/m2 74.47 kg/m2           Relevant Medications   furosemide  (LASIX ) 20 MG tablet     Musculoskeletal and Integument   Knee osteoarthritis   Continue ibuprofen  as needed alternate with Tylenol  as needed Toradol  30 mg injection given in the office today Use of heating pad stretching exercises encouraged Follow-up with orthopedics      Relevant Medications   ibuprofen  (ADVIL ) 800 MG tablet     Other   Morbid obesity (HCC)   Wt Readings from Last 3 Encounters:  04/25/24 (!) 372 lb (168.7 kg)  03/10/24 (!) 381 lb 4.8 oz (173 kg)  03/01/24 (!) 380 lb (172.4 kg)   There is no height or weight on file to calculate BMI.  *Start zepbound   2.5 mg once weekly Patient counseled on low-carb diet, encouraged to engage in regular moderate exercises at least 150 minutes weekly as tolerated Advised to follow-up with bariatric surgery as needed Follow-up in the office in 4  weeks        Relevant Medications   tirzepatide  (ZEPBOUND ) 2.5 MG/0.5ML injection vial   Bilateral leg edema   Follow-up with clinic as planned Continue hydrochlorothiazide  and furosemide  Continue use of compression socks, DASH diet and elevation advised      Encounter for examination following treatment at hospital   Hospital chart reviewed, including discharge summary Medications reconciled and reviewed with the patient in detail       Elevated brain natriuretic peptide (BNP) level - Primary  Continue furosemide  20 mg daily, take with OTC potassium supplement Checking BMP      Relevant Medications   tirzepatide  (ZEPBOUND ) 2.5 MG/0.5ML injection vial   furosemide  (LASIX ) 20 MG tablet   Other Relevant Orders   Basic Metabolic Panel    Meds ordered this encounter  Medications   tirzepatide  (ZEPBOUND ) 2.5 MG/0.5ML injection vial    Sig: Inject 2.5 mg into the skin once a week.    Dispense:  2 mL    Refill:  0   furosemide  (LASIX ) 20 MG tablet    Sig: Take 1 tablet (20 mg total) by mouth daily.    Dispense:  30 tablet    Refill:  3   ibuprofen  (ADVIL ) 800 MG tablet    Sig: Take 1 tablet (800 mg total) by mouth every 8 (eight) hours as needed for moderate pain (pain score 4-6).    Dispense:  30 tablet    Refill:  1    Follow-up: Return in about 4 weeks (around 05/30/2024) for obesity .    Douglass Dunshee R Aleesha Ringstad, FNP

## 2024-05-02 NOTE — Patient Instructions (Addendum)
 You were given Toradol  30mg  injection in the office today.  Please do not take ibuprofen  today can take tomorrow with food, alternate with Tylenol  650 mg every 6 hours as needed  Please continue to take potassium supplement along with Lasix , follow-up with cardiology as planned  Morbid obesity (HCC)  - tirzepatide  (ZEPBOUND ) 2.5 MG/0.5ML injection vial; Inject 2.5 mg into the skin once a week.  Dispense: 2 mL; Refill: 0  Elevated brain natriuretic peptide (BNP) level  - Basic Metabolic Panel - furosemide  (LASIX ) 20 MG tablet; Take 1 tablet (20 mg total) by mouth daily.  Dispense: 30 tablet; Refill: 3  Osteoarthritis of both knees, unspecified osteoarthritis type  - ibuprofen  (ADVIL ) 800 MG tablet; Take 1 tablet (800 mg total) by mouth every 8 (eight) hours as needed for moderate pain (pain score 4-6).  Dispense: 30 tablet; Refill:    It is important that you exercise regularly at least 30 minutes 5 times a week as tolerated  Think about what you will eat, plan ahead. Choose  clean, green, fresh or frozen over canned, processed or packaged foods which are more sugary, salty and fatty. 70 to 75% of food eaten should be vegetables and fruit. Three meals at set times with snacks allowed between meals, but they must be fruit or vegetables. Aim to eat over a 12 hour period , example 7 am to 7 pm, and STOP after  your last meal of the day. Drink water,generally about 64 ounces per day, no other drink is as healthy. Fruit juice is best enjoyed in a healthy way, by EATING the fruit.  Thanks for choosing [facility] we consider it a privelige to serve you.

## 2024-05-02 NOTE — Assessment & Plan Note (Signed)
 Hospital chart reviewed, including discharge summary Medications reconciled and reviewed with the patient in detail

## 2024-05-02 NOTE — Addendum Note (Signed)
 Addended by: VICTORY IHA on: 05/02/2024 04:19 PM   Modules accepted: Orders

## 2024-05-02 NOTE — Assessment & Plan Note (Signed)
 Continue ibuprofen  as needed alternate with Tylenol  as needed Toradol  30 mg injection given in the office today Use of heating pad stretching exercises encouraged Follow-up with orthopedics

## 2024-05-02 NOTE — Assessment & Plan Note (Signed)
 Wt Readings from Last 3 Encounters:  04/25/24 (!) 372 lb (168.7 kg)  03/10/24 (!) 381 lb 4.8 oz (173 kg)  03/01/24 (!) 380 lb (172.4 kg)   There is no height or weight on file to calculate BMI.  *Start zepbound   2.5 mg once weekly Patient counseled on low-carb diet, encouraged to engage in regular moderate exercises at least 150 minutes weekly as tolerated Advised to follow-up with bariatric surgery as needed Follow-up in the office in 4 weeks

## 2024-05-02 NOTE — Transitions of Care (Post Inpatient/ED Visit) (Signed)
 05/02/2024  Name: Jennifer [name] MRN: [id number] DOB: 09-14-1966  Today's TOC FU Call Status:   Patient's Name and Date of Birth confirmed.  Transition Care Management Follow-up Telephone Call Date of Discharge: 04/25/24 Discharge Facility: Other (Non-[facility] Facility) Type of Discharge: Emergency Department How have you been since you were released from the hospital?: Better Any questions or concerns?: No  Items Reviewed: Did you receive and understand the discharge instructions provided?: Yes Medications obtained,verified, and reconciled?: Yes (Medications Reviewed) Any new allergies since your discharge?: No Dietary orders reviewed?: NA Do you have support at home?: Yes People in Home [RPT]: child(ren), adult Name of Support/Comfort Primary Source: Daughter  Medications Reviewed Today: Medications Reviewed Today     Reviewed by Starlene Charlynn BIRCH, CMA (Certified Medical Assistant) on 05/02/24 at 1027  Med List Status: <None>   Medication Order Taking? Sig Documenting Provider Last Dose Status Informant  acetaminophen  (TYLENOL ) 500 MG tablet [id number] Yes Frequency:   Dosage:0   MG  Instructions:  Note:1 tab by mouth as needed for pain every 4 hours (Tylenol ),take with food. [provider]  Active            Med Note MARIA, MICHELE A   Fri Jun 26, 2016  9:45 AM) Received from: [facility]  albuterol  (PROVENTIL ) (5 MG/ML) 0.5% nebulizer solution [id number] Yes Take 0.5 mLs (2.5 mg total) by nebulization every 6 (six) hours as needed for wheezing or shortness of breath. Smoot, Lauraine LABOR, PA-C  Active   atorvastatin  (LIPITOR) 10 MG tablet [id number] Yes Take 1 tablet (10 mg total) by mouth daily. Paseda, Folashade R, FNP  Active   Carbinoxamine  Maleate 4 MG TABS [id number] Yes Take 1 tablet (4 mg total) by mouth in the morning, at noon, and at bedtime. Lorin Norris, MD  Active   famotidine  (PEPCID ) 20 MG tablet [id number] Yes Take 1 tablet (20 mg total) by mouth 2 (two) times  daily. Elnor Bernarda SQUIBB, DO  Active            Med Note (HERNANDEZ-GARCIA, GEORGINA   Mon Apr 19, 2023  9:07 AM) PRN   fluticasone  (FLONASE ) 50 MCG/ACT nasal spray [id number] Yes Place 1 spray into both nostrils 2 (two) times daily. Paseda, Folashade R, FNP  Active   fluticasone  (FLOVENT  HFA) 44 MCG/ACT inhaler [id number] Yes Inhale 2 puffs into the lungs 2 (two) times daily. Lorin Norris, MD  Active   furosemide  (LASIX ) 20 MG tablet [id number] Yes Take 1 tablet (20 mg total) by mouth daily. Prosperi, Christian H, PA-C  Active   hydrochlorothiazide  (HYDRODIURIL ) 12.5 MG tablet [id number] Yes Take 1 tablet (12.5 mg total) by mouth daily. Paseda, Folashade R, FNP  Active   ibuprofen  (ADVIL ) 800 MG tablet [id number] Yes Take 1 tablet (800 mg total) by mouth every 8 (eight) hours as needed for moderate pain (pain score 4-6). Paseda, Folashade R, FNP  Active   meclizine  (ANTIVERT ) 25 MG tablet [id number] Yes Take 1 tablet (25 mg total) by mouth 3 (three) times daily as needed for dizziness. Mesner, Selinda, MD  Active            Med Note (HERNANDEZ-GARCIA, GEORGINA   Mon Apr 19, 2023  9:08 AM) PRN   metFORMIN  (GLUCOPHAGE ) 500 MG tablet [id number]  Take 1 tablet (500 mg total) by mouth 2 (two) times daily with a meal.  Patient not taking: Reported on 05/02/2024   Paseda, Folashade R, FNP  Active   methocarbamol  (ROBAXIN ) 500 MG tablet [id number]  Yes Take 2 tablets (1,000 mg total) by mouth every 8 (eight) hours as needed for muscle spasms. Paseda, Folashade R, FNP  Active   Misc. Devices (ROLLATOR [place]) [place] [id number] Yes 1 each by Does not apply route as needed. Paseda, Folashade R, FNP  Active   pantoprazole  (PROTONIX ) 40 MG tablet [id number] Yes Take 1 tablet (40 mg total) by mouth daily. Cheryl Reusing, FNP  Active   triamcinolone  ointment (KENALOG ) 0.1 % [id number]   Iva Marty Saltness, MD  Active   Vitamin D , Ergocalciferol , (DRISDOL) 1.25 MG (50000 UNIT) CAPS capsule [id number] Yes Take 50,000  Units by mouth once a week. [provider]  Active             Home Care and Equipment/Supplies: Were Home Health Services Ordered?: NA Any new equipment or medical supplies ordered?: NA  Functional Questionnaire: Do you need assistance with bathing/showering or dressing?: No Do you need assistance with meal preparation?: No Do you need assistance with eating?: No Do you have difficulty maintaining continence: No Do you need assistance with getting out of bed/getting out of a chair/moving?: No Do you have difficulty managing or taking your medications?: No  Follow up appointments reviewed: PCP Follow-up appointment confirmed?: Yes Date of PCP follow-up appointment?: 05/02/24 Specialist Hospital Follow-up appointment confirmed?: NA Do you need transportation to your follow-up appointment?: No Do you understand care options if your condition(s) worsen?: Yes-patient verbalized understanding    SIGNATURE Sosie Gato, RMA

## 2024-05-03 ENCOUNTER — Other Ambulatory Visit: Payer: Self-pay | Admitting: Nurse Practitioner

## 2024-05-03 ENCOUNTER — Ambulatory Visit: Payer: Self-pay | Admitting: Nurse Practitioner

## 2024-05-03 DIAGNOSIS — R600 Localized edema: Secondary | ICD-10-CM

## 2024-05-03 DIAGNOSIS — R7989 Other specified abnormal findings of blood chemistry: Secondary | ICD-10-CM

## 2024-05-03 LAB — BASIC METABOLIC PANEL WITH GFR
BUN/Creatinine Ratio: 18 (ref 9–23)
BUN: 18 mg/dL (ref 6–24)
CO2: 23 mmol/L (ref 20–29)
Calcium: 10 mg/dL (ref 8.7–10.2)
Chloride: 101 mmol/L (ref 96–106)
Creatinine, Ser: 1 mg/dL (ref 0.57–1.00)
Glucose: 94 mg/dL (ref 70–99)
Potassium: 4.3 mmol/L (ref 3.5–5.2)
Sodium: 140 mmol/L (ref 134–144)
eGFR: 65 mL/min/1.73 (ref >59)

## 2024-05-09 ENCOUNTER — Telehealth: Payer: Self-pay

## 2024-05-09 NOTE — Telephone Encounter (Signed)
 Copied from CRM #[id number]. Topic: Clinical - Prescription Issue >> May 09, 2024  2:14 PM Sasha H wrote: Reason for CRM: [facility] said a different prescription other than Zepbound  needs to be sent over

## 2024-05-10 ENCOUNTER — Other Ambulatory Visit: Payer: Self-pay

## 2024-05-10 NOTE — Telephone Encounter (Signed)
 Sent pt my chart messaged after checking last active date. KH

## 2024-06-05 ENCOUNTER — Other Ambulatory Visit: Payer: Self-pay | Admitting: Nurse Practitioner

## 2024-06-05 DIAGNOSIS — R7989 Other specified abnormal findings of blood chemistry: Secondary | ICD-10-CM

## 2024-06-05 DIAGNOSIS — R600 Localized edema: Secondary | ICD-10-CM

## 2024-06-05 DIAGNOSIS — I517 Cardiomegaly: Secondary | ICD-10-CM

## 2024-06-07 ENCOUNTER — Ambulatory Visit (HOSPITAL_BASED_OUTPATIENT_CLINIC_OR_DEPARTMENT_OTHER)
Admission: RE | Admit: 2024-06-07 | Discharge: 2024-06-07 | Disposition: A | Discharge status: Home / Self Care | Source: Ambulatory Visit | Attending: Nurse Practitioner | Admitting: Nurse Practitioner

## 2024-06-07 DIAGNOSIS — R600 Localized edema: Secondary | ICD-10-CM | POA: Diagnosis not present

## 2024-06-07 DIAGNOSIS — R7989 Other specified abnormal findings of blood chemistry: Secondary | ICD-10-CM | POA: Insufficient documentation

## 2024-06-07 DIAGNOSIS — I517 Cardiomegaly: Secondary | ICD-10-CM | POA: Insufficient documentation

## 2024-06-07 LAB — ECHOCARDIOGRAM COMPLETE
AR max vel: 2.22 cm2
AV Area VTI: 2.11 cm2
AV Area mean vel: 2.19 cm2
AV Mean grad: 5 mmHg
AV Peak grad: 8.3 mmHg
Ao pk vel: 1.44 m/s
Area-P 1/2: 3.16 cm2
Calc EF: 60.1 %
S' Lateral: 3.1 cm
Single Plane A2C EF: 60.7 %
Single Plane A4C EF: 62.4 %

## 2024-06-09 ENCOUNTER — Ambulatory Visit (INDEPENDENT_AMBULATORY_CARE_PROVIDER_SITE_OTHER): Payer: Self-pay | Admitting: Nurse Practitioner

## 2024-06-09 ENCOUNTER — Ambulatory Visit: Payer: Self-pay | Admitting: Nurse Practitioner

## 2024-06-09 ENCOUNTER — Encounter: Payer: Self-pay | Admitting: Nurse Practitioner

## 2024-06-09 VITALS — BP 143/63 | HR 90 | Wt 372.4 lb

## 2024-06-09 DIAGNOSIS — I10 Essential (primary) hypertension: Secondary | ICD-10-CM | POA: Diagnosis not present

## 2024-06-09 DIAGNOSIS — R600 Localized edema: Secondary | ICD-10-CM

## 2024-06-09 DIAGNOSIS — I5189 Other ill-defined heart diseases: Secondary | ICD-10-CM

## 2024-06-09 DIAGNOSIS — M170 Bilateral primary osteoarthritis of knee: Secondary | ICD-10-CM

## 2024-06-09 DIAGNOSIS — R7989 Other specified abnormal findings of blood chemistry: Secondary | ICD-10-CM

## 2024-06-09 MED ORDER — FUROSEMIDE 20 MG PO TABS: 20.0000 mg | ORAL_TABLET | Freq: Every day | ORAL | 3 refills | Status: DC

## 2024-06-09 NOTE — Progress Notes (Signed)
 Established Patient Office Visit  Subjective:  Patient ID: Jennifer [name], female    DOB: 1966-07-10  Age: 58 y.o. MRN: [id number]  CC:  Chief Complaint  Patient presents with   Medical Management of Chronic Issues    HPI    Discussed the use of AI scribe software for clinical note transcription with the patient, who gave verbal consent to proceed.  History of Present Illness Jennifer [name] is a 58 year old female  has a past medical history of Arthritis, Eczema, GERD (gastroesophageal reflux disease), High cholesterol, Hypertension, Obesity, Seasonal allergies, and Vitamin D  deficiency.  Patient presents for follow-up for her chronic medical conditions  She recently underwent an echocardiogram. She has been informed of fluid around her heart and swelling in her legs, which she attributes to lymphedema. She is currently undergoing physical therapy twice a week and is awaiting compression stockings to help manage the swelling.  She has a history of hypertension, with a recent reading of 143/63 mmHg. She takes hydrochlorothiazide  12.5mg  daily, although she missed a dose recently due to a busy schedule. She also takes furosemide  20mg  daily and is on her last prescription, requiring a refill soon. Additionally, she takes OTC potassium and over-the-counter vitamin D  supplements. Her home blood pressure readings are typically around 117/70 mmHg.  She experiences knee pain and recently received injections in both knees. She uses ibuprofen  for pain management but limits its use due to potential side effects. She also takes pantoprazole  for acid reflux.  She is actively trying to lose weight and has a bariatric appointment scheduled. She has stopped drinking soda since July 4th but has started drinking sweet tea. She recognizes the need to improve her diet and increase physical activity, although she is limited by knee pain and uses a walker for mobility.  She felt lightheaded this morning, which  she associates with her diuretic medication.    Past Medical History:  Diagnosis Date   Arthritis    Eczema    GERD (gastroesophageal reflux disease)    High cholesterol    Hypertension    Obesity    Seasonal allergies    Vitamin D  deficiency     Past Surgical History:  Procedure Laterality Date   CESAREAN SECTION     TONSILLECTOMY     TUBAL LIGATION      Family History  Problem Relation Age of Onset   Hepatitis B Mother    High blood pressure Mother    GER disease Father    High blood pressure Father    Allergic rhinitis Sister    Allergic rhinitis Sister    Sinusitis Sister    Colon cancer Daughter    Angioedema Neg Hx    Eczema Neg Hx    Urticaria Neg Hx    Immunodeficiency Neg Hx     Social History   Socioeconomic History   Marital status: Divorced    Spouse name: Not on file   Number of children: 1   Years of education: Not on file   Highest education level: Some college, no degree  Occupational History   Not on file  Tobacco Use   Smoking status: Never   Smokeless tobacco: Never  Vaping Use   Vaping status: Never Used  Substance and Sexual Activity   Alcohol use: No   Drug use: No   Sexual activity: Yes    Birth control/protection: None, Surgical  Other Topics Concern   Not on file  Social History Narrative  Lives with grandchildren.    Social Drivers of Health   Financial Resource Strain: High Risk (02/29/2024)   Overall Financial Resource Strain (CARDIA)    Difficulty of Paying Living Expenses: Very hard  Food Insecurity: Food Insecurity Present (02/29/2024)   Hunger Vital Sign    Worried About Running Out of Food in the Last Year: Often true    Ran Out of Food in the Last Year: Never true  Transportation Needs: No Transportation Needs (02/29/2024)   PRAPARE - Administrator, Civil Service (Medical): No    Lack of Transportation (Non-Medical): No  Physical Activity: Unknown (02/29/2024)   Exercise Vital Sign    Days of  Exercise per Week: 0 days    Minutes of Exercise per Session: Not on file  Stress: No Stress Concern Present (02/29/2024)   [employer] of Occupational Health - Occupational Stress Questionnaire    Feeling of Stress : Not at all  Social Connections: Moderately Isolated (02/29/2024)   Social Connection and Isolation Panel    Frequency of Communication with Friends and Family: More than three times a week    Frequency of Social Gatherings with Friends and Family: Twice a week    Attends Religious Services: More than 4 times per year    Active Member of [employer] or Organizations: No    Attends Banker Meetings: Not on file    Marital Status: Separated  Intimate Partner Violence: Not At Risk (12/09/2023)   Humiliation, Afraid, Rape, and Kick questionnaire    Fear of Current or Ex-Partner: No    Emotionally Abused: No    Physically Abused: No    Sexually Abused: No    Outpatient Medications Prior to Visit  Medication Sig Dispense Refill   acetaminophen  (TYLENOL ) 500 MG tablet Frequency:   Dosage:0   MG  Instructions:  Note:1 tab by mouth as needed for pain every 4 hours (Tylenol ),take with food.     albuterol  (PROVENTIL ) (5 MG/ML) 0.5% nebulizer solution Take 0.5 mLs (2.5 mg total) by nebulization every 6 (six) hours as needed for wheezing or shortness of breath. 20 mL 12   atorvastatin  (LIPITOR) 10 MG tablet Take 1 tablet (10 mg total) by mouth daily. 90 tablet 1   Carbinoxamine  Maleate 4 MG TABS Take 1 tablet (4 mg total) by mouth in the morning, at noon, and at bedtime. 90 tablet 5   famotidine  (PEPCID ) 20 MG tablet Take 1 tablet (20 mg total) by mouth 2 (two) times daily. 30 tablet 0   fluticasone  (FLONASE ) 50 MCG/ACT nasal spray Place 1 spray into both nostrils 2 (two) times daily. 15.8 mL 3   fluticasone  (FLOVENT  HFA) 44 MCG/ACT inhaler Inhale 2 puffs into the lungs 2 (two) times daily. 1 each 12   hydrochlorothiazide  (HYDRODIURIL ) 12.5 MG tablet Take 1 tablet (12.5 mg  total) by mouth daily. 90 tablet 1   ibuprofen  (ADVIL ) 800 MG tablet Take 1 tablet (800 mg total) by mouth every 8 (eight) hours as needed for moderate pain (pain score 4-6). 30 tablet 1   meclizine  (ANTIVERT ) 25 MG tablet Take 1 tablet (25 mg total) by mouth 3 (three) times daily as needed for dizziness. 30 tablet 0   methocarbamol  (ROBAXIN ) 500 MG tablet Take 2 tablets (1,000 mg total) by mouth every 8 (eight) hours as needed for muscle spasms. 60 tablet 0   Misc. Devices (ROLLATOR ULTRA-LIGHT) MISC 1 each by Does not apply route as needed. 1 each EACH   pantoprazole  (  PROTONIX ) 40 MG tablet Take 1 tablet (40 mg total) by mouth daily. 30 tablet 5   Potassium Chloride CRYS by Does not apply route.     furosemide  (LASIX ) 20 MG tablet Take 1 tablet (20 mg total) by mouth daily. 30 tablet 3   metFORMIN  (GLUCOPHAGE ) 500 MG tablet Take 1 tablet (500 mg total) by mouth 2 (two) times daily with a meal. (Patient not taking: Reported on 06/09/2024) 180 tablet 2   tirzepatide  (ZEPBOUND ) 2.5 MG/0.5ML injection vial Inject 2.5 mg into the skin once a week. (Patient not taking: Reported on 06/09/2024) 2 mL 0   Vitamin D , Ergocalciferol , (DRISDOL) 1.25 MG (50000 UNIT) CAPS capsule Take 50,000 Units by mouth once a week. (Patient not taking: Reported on 06/09/2024)     Facility-Administered Medications Prior to Visit  Medication Dose Route Frequency Provider Last Rate Last Admin   triamcinolone  ointment (KENALOG ) 0.1 %   Topical BID Iva Marty Saltness, MD        Allergies  Allergen Reactions   Latex Other (See Comments) and Hives    Powder causes hives  hives    ROS Review of Systems  Constitutional:  Negative for appetite change, chills, fatigue and fever.  HENT:  Negative for congestion, postnasal drip, rhinorrhea and sneezing.   Respiratory:  Negative for cough, shortness of breath and wheezing.   Cardiovascular:  Positive for leg swelling. Negative for chest pain and palpitations.   Gastrointestinal:  Negative for abdominal pain, constipation, nausea and vomiting.  Genitourinary:  Negative for difficulty urinating, dysuria, flank pain and frequency.  Musculoskeletal:  Positive for arthralgias. Negative for back pain, joint swelling and myalgias.  Skin:  Negative for color change, pallor, rash and wound.  Neurological:  Negative for facial asymmetry, weakness, numbness and headaches.  Psychiatric/Behavioral:  Negative for behavioral problems, confusion, self-injury and suicidal ideas.       Objective:    Physical Exam Vitals and nursing note reviewed.  Constitutional:      General: She is not in acute distress.    Appearance: Normal appearance. She is obese. She is not ill-appearing, toxic-appearing or diaphoretic.  Eyes:     General: No scleral icterus.       Right eye: No discharge.        Left eye: No discharge.     Extraocular Movements: Extraocular movements intact.     Conjunctiva/sclera: Conjunctivae normal.  Cardiovascular:     Rate and Rhythm: Normal rate and regular rhythm.     Pulses: Normal pulses.     Heart sounds: Normal heart sounds. No murmur heard.    No friction rub. No gallop.  Pulmonary:     Effort: Pulmonary effort is normal. No respiratory distress.     Breath sounds: Normal breath sounds. No stridor. No wheezing, rhonchi or rales.  Chest:     Chest wall: No tenderness.  Abdominal:     General: There is no distension.     Palpations: Abdomen is soft.     Tenderness: There is no abdominal tenderness. There is no right CVA tenderness, left CVA tenderness or guarding.  Musculoskeletal:        General: No deformity or signs of injury.     Right lower leg: Edema present.     Left lower leg: Edema present.     Comments: Using a walker for ambulation  Skin:    General: Skin is warm and dry.     Capillary Refill: Capillary refill takes less than 2  seconds.     Coloration: Skin is not jaundiced or pale.     Findings: No bruising,  erythema or lesion.  Neurological:     Mental Status: She is alert and oriented to person, place, and time.     Motor: No weakness.     Coordination: Coordination normal.     Gait: Gait abnormal.  Psychiatric:        Mood and Affect: Mood normal.        Behavior: Behavior normal.        Thought Content: Thought content normal.        Judgment: Judgment normal.     BP (!) 143/63   Pulse 90   Wt (!) 372 lb 6.4 oz (168.9 kg)   LMP 04/07/2017   SpO2 97%   BMI 72.73 kg/m  Wt Readings from Last 3 Encounters:  06/09/24 (!) 372 lb 6.4 oz (168.9 kg)  04/25/24 (!) 372 lb (168.7 kg)  03/10/24 (!) 381 lb 4.8 oz (173 kg)    No results found for: TSH Lab Results  Component Value Date   WBC 7.1 04/25/2024   HGB 11.5 (L) 04/25/2024   HCT 35.0 (L) 04/25/2024   MCV 91.9 04/25/2024   PLT 204 04/25/2024   Lab Results  Component Value Date   NA 140 05/02/2024   K 4.3 05/02/2024   CO2 23 05/02/2024   GLUCOSE 94 05/02/2024   BUN 18 05/02/2024   CREATININE 1.00 05/02/2024   BILITOT 0.9 12/05/2023   ALKPHOS 75 12/05/2023   AST 18 12/05/2023   ALT 16 12/05/2023   PROT 7.2 12/05/2023   ALBUMIN 3.1 (L) 12/05/2023   CALCIUM  10.0 05/02/2024   ANIONGAP 12 04/25/2024   EGFR 65 05/02/2024   Lab Results  Component Value Date   CHOL 172 01/15/2023   Lab Results  Component Value Date   HDL 52 01/15/2023   Lab Results  Component Value Date   LDLCALC 108 (H) 01/15/2023   Lab Results  Component Value Date   TRIG 64 01/15/2023   Lab Results  Component Value Date   CHOLHDL 3.3 01/15/2023   No results found for: HGBA1C    Assessment & Plan:  Assessment and Plan Assessment & Plan    Problem List Items Addressed This Visit       Cardiovascular and Mediastinum   Hypertension, essential - Primary        06/09/2024    3:10 PM 06/09/2024    2:57 PM 05/02/2024    1:52 PM 04/25/2024    8:00 PM 04/25/2024    7:15 PM 04/25/2024    6:30 PM 04/25/2024    6:03 PM  BP/Weight   Systolic BP 143 145 103 137 148 153 143  Diastolic BP 63 70 63 69 84 79 73  Wt. (Lbs)  372.4       BMI  72.73 kg/m2           Blood pressure 143/63 in office; home readings 114-117/70.  Consistently taking hydrochlorothiazide  and furosemide , but missed furosemide  today. Reports lightheadedness, possibly due to hypotension. - Continue current antihypertensive regimen with hydrochlorothiazide  12.5 mg daily and furosemide  20 mg daily - Monitor blood pressure at home and report low readings. - Check renal function due to diuretic use. The importance of attaining a healthy weight is also discussed.      Relevant Medications   furosemide  (LASIX ) 20 MG tablet   Other Relevant Orders   Basic Metabolic Panel  Musculoskeletal and Integument   Knee osteoarthritis    Received knee injections yesterday. Pain managed with ibuprofen , advised limit NSAID to use Tylenol  due to NSAID risks to heart and kidneys. Weight loss expected to alleviate knee pain. - Use Tylenol  650 mg every 6 hours as needed for pain  - Continue weight loss efforts to reduce knee pain.        Other   Morbid obesity (HCC)   Wt Readings from Last 3 Encounters:  06/09/24 (!) 372 lb 6.4 oz (168.9 kg)  04/25/24 (!) 372 lb (168.7 kg)  03/10/24 (!) 381 lb 4.8 oz (173 kg)   Body mass index is 72.73 kg/m.   Planning bariatric surgery; weight loss appointment next week. Concerns about heart condition affecting surgery eligibility. Encouraged to continue weight loss through diet and exercise. - Attend weight loss appointment next week. - Continue weight loss efforts through diet and exercise. - Avoid sugary drinks and focus on water intake.      Bilateral leg edema   Furosemide  20 mg daily and uses compression socks Continue current medications, Continue PT at [facility]       Elevated brain natriuretic peptide (BNP) level   Relevant Medications   furosemide  (LASIX ) 20 MG tablet   Grade I diastolic dysfunction    Echocardiogram showed impaired left ventricular relaxation. Elevated BNP at 373 i Symptoms included leg swelling and potential fluid retention. Continue  furosemide  20 mg daily - Use compression stockings as prescribed for edema - Attend cardiology appointment in October for further evaluation.       Relevant Medications   furosemide  (LASIX ) 20 MG tablet    Meds ordered this encounter  Medications   furosemide  (LASIX ) 20 MG tablet    Sig: Take 1 tablet (20 mg total) by mouth daily.    Dispense:  30 tablet    Refill:  3    Follow-up: Return in about 4 months (around 10/09/2024) for HTN.    Loreda Silverio R Oryon Gary, FNP

## 2024-06-09 NOTE — Patient Instructions (Addendum)
 Please take Tylenol  650 mg every 6 hours as needed pain    It is important that you exercise regularly at least 30 minutes 5 times a week as tolerated  Think about what you will eat, plan ahead. Choose  clean, green, fresh or frozen over canned, processed or packaged foods which are more sugary, salty and fatty. 70 to 75% of food eaten should be vegetables and fruit. Three meals at set times with snacks allowed between meals, but they must be fruit or vegetables. Aim to eat over a 12 hour period , example 7 am to 7 pm, and STOP after  your last meal of the day. Drink water,generally about 64 ounces per day, no other drink is as healthy. Fruit juice is best enjoyed in a healthy way, by EATING the fruit.  Thanks for choosing [facility] we consider it a privelige to serve you.

## 2024-06-09 NOTE — Assessment & Plan Note (Signed)
  Received knee injections yesterday. Pain managed with ibuprofen , advised limit NSAID to use Tylenol  due to NSAID risks to heart and kidneys. Weight loss expected to alleviate knee pain. - Use Tylenol  650 mg every 6 hours as needed for pain  - Continue weight loss efforts to reduce knee pain.

## 2024-06-09 NOTE — Assessment & Plan Note (Signed)
 Furosemide  20 mg daily and uses compression socks Continue current medications, Continue PT at [facility]

## 2024-06-09 NOTE — Assessment & Plan Note (Signed)
 Wt Readings from Last 3 Encounters:  06/09/24 (!) 372 lb 6.4 oz (168.9 kg)  04/25/24 (!) 372 lb (168.7 kg)  03/10/24 (!) 381 lb 4.8 oz (173 kg)   Body mass index is 72.73 kg/m.   Planning bariatric surgery; weight loss appointment next week. Concerns about heart condition affecting surgery eligibility. Encouraged to continue weight loss through diet and exercise. - Attend weight loss appointment next week. - Continue weight loss efforts through diet and exercise. - Avoid sugary drinks and focus on water intake.

## 2024-06-09 NOTE — Assessment & Plan Note (Signed)
      06/09/2024    3:10 PM 06/09/2024    2:57 PM 05/02/2024    1:52 PM 04/25/2024    8:00 PM 04/25/2024    7:15 PM 04/25/2024    6:30 PM 04/25/2024    6:03 PM  BP/Weight  Systolic BP 143 145 103 137 148 153 143  Diastolic BP 63 70 63 69 84 79 73  Wt. (Lbs)  372.4       BMI  72.73 kg/m2           Blood pressure 143/63 in office; home readings 114-117/70.  Consistently taking hydrochlorothiazide  and furosemide , but missed furosemide  today. Reports lightheadedness, possibly due to hypotension. - Continue current antihypertensive regimen with hydrochlorothiazide  12.5 mg daily and furosemide  20 mg daily - Monitor blood pressure at home and report low readings. - Check renal function due to diuretic use. The importance of attaining a healthy weight is also discussed.

## 2024-06-09 NOTE — Assessment & Plan Note (Signed)
 Echocardiogram showed impaired left ventricular relaxation. Elevated BNP at 373 i Symptoms included leg swelling and potential fluid retention. Continue  furosemide  20 mg daily - Use compression stockings as prescribed for edema - Attend cardiology appointment in October for further evaluation.

## 2024-06-10 LAB — BASIC METABOLIC PANEL WITH GFR
BUN/Creatinine Ratio: 21 (ref 9–23)
BUN: 17 mg/dL (ref 6–24)
CO2: 22 mmol/L (ref 20–29)
Calcium: 9.2 mg/dL (ref 8.7–10.2)
Chloride: 104 mmol/L (ref 96–106)
Creatinine, Ser: 0.8 mg/dL (ref 0.57–1.00)
Glucose: 102 mg/dL — ABNORMAL HIGH (ref 70–99)
Potassium: 4.2 mmol/L (ref 3.5–5.2)
Sodium: 140 mmol/L (ref 134–144)
eGFR: 85 mL/min/1.73 (ref >59)

## 2024-06-12 ENCOUNTER — Ambulatory Visit: Payer: Self-pay | Admitting: Nurse Practitioner

## 2024-07-04 ENCOUNTER — Other Ambulatory Visit: Payer: Self-pay | Admitting: Nurse Practitioner

## 2024-07-04 DIAGNOSIS — M170 Bilateral primary osteoarthritis of knee: Secondary | ICD-10-CM

## 2024-07-04 NOTE — Telephone Encounter (Signed)
 Please advise [facility]

## 2024-07-08 NOTE — Progress Notes (Signed)
 Referring-Jennifer [name], FN  Reason for referral-peripheral edema and possible CHF  HPI: 58 year old female for evaluation of peripheral edema and possible CHF at request of Jennifer [name], FN. Venous Dopplers July 2025 showed no DVT.  Echocardiogram August 2025 showed normal LV function, grade 1 diastolic dysfunction.  proBNP July 2025 373.  Laboratories August 2025 showed TSH 4.380, creatinine 0.80, sodium 140, potassium 4.2.  Recently noted to have peripheral edema and cardiology asked to evaluate.  Patient denies dyspnea on exertion, orthopnea, PND, chest pain or syncope.  She does have bilateral mild lower extremity edema that is improved with diuretic.  Current Outpatient Medications  Medication Sig Dispense Refill   acetaminophen  (TYLENOL ) 500 MG tablet Frequency:   Dosage:0   MG  Instructions:  Note:1 tab by mouth as needed for pain every 4 hours (Tylenol ),take with food.     albuterol  (PROVENTIL ) (5 MG/ML) 0.5% nebulizer solution Take 0.5 mLs (2.5 mg total) by nebulization every 6 (six) hours as needed for wheezing or shortness of breath. 20 mL 12   atorvastatin  (LIPITOR) 10 MG tablet Take 1 tablet (10 mg total) by mouth daily. 90 tablet 1   Carbinoxamine  Maleate 4 MG TABS Take 1 tablet (4 mg total) by mouth in the morning, at noon, and at bedtime. 90 tablet 5   famotidine  (PEPCID ) 20 MG tablet Take 1 tablet (20 mg total) by mouth 2 (two) times daily. 30 tablet 0   fluticasone  (FLONASE ) 50 MCG/ACT nasal spray Place 1 spray into both nostrils 2 (two) times daily. 15.8 mL 3   fluticasone  (FLOVENT  HFA) 44 MCG/ACT inhaler Inhale 2 puffs into the lungs 2 (two) times daily. 1 each 12   furosemide  (LASIX ) 20 MG tablet Take 1 tablet (20 mg total) by mouth daily. 30 tablet 3   hydrochlorothiazide  (HYDRODIURIL ) 12.5 MG tablet Take 1 tablet (12.5 mg total) by mouth daily. 90 tablet 1   ibuprofen  (ADVIL ) 800 MG tablet Take 1 tablet (800 mg total) by mouth every 8 (eight) hours as needed  for moderate pain (pain score 4-6). 30 tablet 1   metFORMIN  (GLUCOPHAGE ) 500 MG tablet Take 1 tablet (500 mg total) by mouth 2 (two) times daily with a meal. 180 tablet 2   methocarbamol  (ROBAXIN ) 500 MG tablet TAKE 2 TABLETS BY MOUTH EVERY 8 HOURS AS NEEDED FOR  MUSCLE  SPASMS 60 tablet 0   Misc. Devices (ROLLATOR ULTRA-LIGHT) MISC 1 each by Does not apply route as needed. 1 each EACH   pantoprazole  (PROTONIX ) 40 MG tablet Take 1 tablet (40 mg total) by mouth daily. 30 tablet 5   Potassium Chloride CRYS by Does not apply route.     Current Facility-Administered Medications  Medication Dose Route Frequency Provider Last Rate Last Admin   triamcinolone  ointment (KENALOG ) 0.1 %   Topical BID Iva Marty Saltness, MD        Allergies  Allergen Reactions   Latex Other (See Comments) and Hives    Powder causes hives  hives     Past Medical History:  Diagnosis Date   Arthritis    Eczema    GERD (gastroesophageal reflux disease)    High cholesterol    Hypertension    Obesity    Seasonal allergies    Vitamin D  deficiency     Past Surgical History:  Procedure Laterality Date   CESAREAN SECTION     TONSILLECTOMY     TUBAL LIGATION      Social History  Socioeconomic History   Marital status: Divorced    Spouse name: Not on file   Number of children: 2   Years of education: Not on file   Highest education level: Some college, no degree  Occupational History   Not on file  Tobacco Use   Smoking status: Never   Smokeless tobacco: Never  Vaping Use   Vaping status: Never Used  Substance and Sexual Activity   Alcohol use: No   Drug use: No   Sexual activity: Yes    Birth control/protection: None, Surgical  Other Topics Concern   Not on file  Social History Narrative   Lives with grandchildren.    Social Drivers of Health   Financial Resource Strain: High Risk (02/29/2024)   Overall Financial Resource Strain (CARDIA)    Difficulty of Paying Living Expenses: Very  hard  Food Insecurity: Food Insecurity Present (02/29/2024)   Hunger Vital Sign    Worried About Running Out of Food in the Last Year: Often true    Ran Out of Food in the Last Year: Never true  Transportation Needs: No Transportation Needs (02/29/2024)   PRAPARE - Administrator, Civil Service (Medical): No    Lack of Transportation (Non-Medical): No  Physical Activity: Unknown (02/29/2024)   Exercise Vital Sign    Days of Exercise per Week: 0 days    Minutes of Exercise per Session: Not on file  Stress: No Stress Concern Present (02/29/2024)   [employer] of Occupational Health - Occupational Stress Questionnaire    Feeling of Stress : Not at all  Social Connections: Moderately Isolated (02/29/2024)   Social Connection and Isolation Panel    Frequency of Communication with Friends and Family: More than three times a week    Frequency of Social Gatherings with Friends and Family: Twice a week    Attends Religious Services: More than 4 times per year    Active Member of [employer] or Organizations: No    Attends Banker Meetings: Not on file    Marital Status: Separated  Intimate Partner Violence: Not At Risk (12/09/2023)   Humiliation, Afraid, Rape, and Kick questionnaire    Fear of Current or Ex-Partner: No    Emotionally Abused: No    Physically Abused: No    Sexually Abused: No    Family History  Problem Relation Age of Onset   Hepatitis B Mother    High blood pressure Mother    GER disease Father    High blood pressure Father    Allergic rhinitis Sister    Allergic rhinitis Sister    Sinusitis Sister    Colon cancer Daughter    Angioedema Neg Hx    Eczema Neg Hx    Urticaria Neg Hx    Immunodeficiency Neg Hx     ROS: Knee arthralgias but no fevers or chills, productive cough, hemoptysis, dysphasia, odynophagia, melena, hematochezia, dysuria, hematuria, rash, seizure activity, orthopnea, PND, claudication. Remaining systems are  negative.  Physical Exam:   Blood pressure (!) 147/71, pulse (!) 116, height 5' (1.524 m), weight (!) 372 lb (168.7 kg), last menstrual period 04/07/2017, SpO2 94%.  General:  Well developed/morbidly obese in NAD Skin warm/dry Patient not depressed No peripheral clubbing Back-normal HEENT-normal/normal eyelids Neck supple/normal carotid upstroke bilaterally; no bruits; no JVD; no thyromegaly chest - CTA/ normal expansion CV - RRR/normal S1 and S2; no murmurs, rubs or gallops;  PMI nondisplaced Abdomen -NT/ND, no HSM, no mass, + bowel sounds, no  bruit 2+ femoral pulses, no bruits Ext-1+ edema, no chords, 2+ DP Neuro-grossly nonfocal  ECG -April 25, 2024-sinus rhythm with probable precordial lead reversal.  Personally reviewed  A/P  1 peripheral edema-likely secondary to venous insufficiency/morbid obesity.  LV function normal.  proBNP minimally elevated.  Continue diuretic.  2 hypertension-blood pressure is mildly elevated.  I will ask her to follow this.  Her regimen can be advanced with goal systolic blood pressure of 130 or less and diastolic less than 85.  3 morbid obesity-she is being evaluated for possible gastric sleeve.  I think she can proceed without further cardiac testing.  4 hyperlipidemia-continue statin.  We discussed a calcium  score today for risk stratification but she stated expense was prohibitive.  Redell Shallow, MD

## 2024-07-13 ENCOUNTER — Other Ambulatory Visit (HOSPITAL_BASED_OUTPATIENT_CLINIC_OR_DEPARTMENT_OTHER): Payer: Self-pay | Admitting: Nurse Practitioner

## 2024-07-13 DIAGNOSIS — Z1231 Encounter for screening mammogram for malignant neoplasm of breast: Secondary | ICD-10-CM

## 2024-07-19 ENCOUNTER — Encounter: Payer: Self-pay | Admitting: Cardiology

## 2024-07-19 ENCOUNTER — Ambulatory Visit (INDEPENDENT_AMBULATORY_CARE_PROVIDER_SITE_OTHER): Admitting: Cardiology

## 2024-07-19 ENCOUNTER — Ambulatory Visit
Admission: RE | Admit: 2024-07-19 | Discharge: 2024-07-19 | Disposition: A | Discharge status: Home / Self Care | Source: Ambulatory Visit | Attending: Nurse Practitioner | Admitting: Nurse Practitioner

## 2024-07-19 ENCOUNTER — Encounter (HOSPITAL_BASED_OUTPATIENT_CLINIC_OR_DEPARTMENT_OTHER): Payer: Self-pay

## 2024-07-19 ENCOUNTER — Ambulatory Visit (HOSPITAL_BASED_OUTPATIENT_CLINIC_OR_DEPARTMENT_OTHER)

## 2024-07-19 DIAGNOSIS — R600 Localized edema: Secondary | ICD-10-CM

## 2024-07-19 DIAGNOSIS — E7800 Pure hypercholesterolemia, unspecified: Secondary | ICD-10-CM | POA: Diagnosis not present

## 2024-07-19 DIAGNOSIS — I10 Essential (primary) hypertension: Secondary | ICD-10-CM | POA: Insufficient documentation

## 2024-07-19 DIAGNOSIS — Z1231 Encounter for screening mammogram for malignant neoplasm of breast: Secondary | ICD-10-CM | POA: Insufficient documentation

## 2024-07-19 NOTE — Patient Instructions (Signed)
  Follow-Up: At [facility], you and your health needs are our priority.  As part of our continuing mission to provide you with exceptional heart care, our providers are all part of one team.  This team includes your primary Cardiologist (physician) and Advanced Practice Providers or APPs (Physician Assistants and Nurse Practitioners) who all work together to provide you with the care you need, when you need it.  Your next appointment:    As needed

## 2024-07-21 ENCOUNTER — Ambulatory Visit: Payer: Self-pay | Admitting: Nurse Practitioner

## 2024-09-21 ENCOUNTER — Ambulatory Visit: Admitting: Family

## 2024-09-27 NOTE — Patient Instructions (Addendum)
 Recurrent sinus infections and bronchitis Past history: Jennifer [name] experiences recurrent sinus infections and bronchitis, leading to multiple ER visits. Symptoms include rhinorrhea, nasal congestion, epiphora, dry cough, and production of thick green sputum, worsening in fall and winter and improving in summer.   - Start flovent  (fluticasone ) 44mcg 2 puffs twice daily. Make sure and use this every day - Continue albuterol  2 puffs every 4-6 hours as needed for cough, wheeze, dyspnea   Allergic rhinitis Past history: Jennifer [name] presents with allergic rhinitis, characterized by rhinorrhea, nasal congestion, epiphora, and post-nasal drip. Previous allergy  testing indicated sensitivities to [place] grass, Cladosporium, dust mite, dog, [place], and Mucor. Current management includes cetirizine and fluticasone , but cetirizine is no longer effective. Immunotherapy is a potential treatment to retrain the immune system and potentially induce tolerance to allergens such as dog. - Skin testing on 01/24/24 was positive to meadow fescue and perennial rye with adequate controls. Intradermal skin testing was positive to dust mite mix, cat hair, and dog epithelia - Continue carbinoxamine  4mg  2-3 times per day - Continue flonase  1 spray per nostril twice daily  - Continue allergen avoidance measures as below - Consider immunotherapy to retrain the immune system and potentially induce tolerance to allergens.  Gastroesophageal reflux disease (GERD)  Past history: Jennifer [name]'s GERD is managed with pantoprazole  and over-the-counter calcium  carbonate. Symptoms are generally well-controlled, with pantoprazole  used as needed for severe symptoms.Interim history : worsening symptoms since off pantoprazole  - Continue pantoprazole  as needed.  Follow up:6 months or sooner if needed  Reducing Pollen Exposure The American Academy of Allergy , Asthma and Immunology suggests the following steps to reduce your exposure to pollen during  allergy  seasons. Do not hang sheets or clothing out to dry; pollen may collect on these items. Do not mow lawns or spend time around freshly cut grass; mowing stirs up pollen. Keep windows closed at night.  Keep car windows closed while driving. Minimize morning activities outdoors, a time when pollen counts are usually at their highest. Stay indoors as much as possible when pollen counts or humidity is high and on windy days when pollen tends to remain in the air longer. Use air conditioning when possible.  Many air conditioners have filters that trap the pollen spores. Use a HEPA room air filter to remove pollen form the indoor air you breathe.  Control of Dust Mite Allergen Dust mites play a major role in allergic asthma and rhinitis. They occur in environments with high humidity wherever human skin is found. Dust mites absorb humidity from the atmosphere (ie, they do not drink) and feed on organic matter (including shed human and animal skin). Dust mites are a microscopic type of insect that you cannot see with the naked eye. High levels of dust mites have been detected from mattresses, pillows, carpets, upholstered furniture, bed covers, clothes, soft toys and any woven material. The principal allergen of the dust mite is found in its feces. A gram of dust may contain 1,000 mites and 250,000 fecal particles. Mite antigen is easily measured in the air during house cleaning activities. Dust mites do not bite and do not cause harm to humans, other than by triggering allergies/asthma.  Ways to decrease your exposure to dust mites in your home:  1. Encase mattresses, box springs and pillows with a mite-impermeable barrier or cover  2. Wash sheets, blankets and drapes weekly in hot water (130 F) with detergent and dry them in a dryer on the hot setting.  3. Have the room cleaned frequently  with a vacuum cleaner and a damp dust-mop. For carpeting or rugs, vacuuming with a vacuum cleaner equipped  with a high-efficiency particulate air (HEPA) filter. The dust mite allergic individual should not be in a room which is being cleaned and should wait 1 hour after cleaning before going into the room.  4. Do not sleep on upholstered furniture (eg, couches).  5. If possible removing carpeting, upholstered furniture and drapery from the home is ideal. Horizontal blinds should be eliminated in the rooms where the person spends the most time (bedroom, study, television room). Washable vinyl, roller-type shades are optimal.  6. Remove all non-washable stuffed toys from the bedroom. Wash stuffed toys weekly like sheets and blankets above.  7. Reduce indoor humidity to less than 50%. Inexpensive humidity monitors can be purchased at most hardware stores. Do not use a humidifier as can make the problem worse and are not recommended.   Control of Dog or Cat Allergen Avoidance is the best way to manage a dog or cat allergy . If you have a dog or cat and are allergic to dog or cats, consider removing the dog or cat from the home. If you have a dog or cat but dont want to find it a new home, or if your family wants a pet even though someone in the household is allergic, here are some strategies that may help keep symptoms at bay:  Keep the pet out of your bedroom and restrict it to only a few rooms. Be advised that keeping the dog or cat in only one room will not limit the allergens to that room. Dont pet, hug or kiss the dog or cat; if you do, wash your hands with soap and water. High-efficiency particulate air (HEPA) cleaners run continuously in a bedroom or living room can reduce allergen levels over time. Regular use of a high-efficiency vacuum cleaner or a central vacuum can reduce allergen levels. Giving your dog or cat a bath at least once a week can reduce airborne allergen.

## 2024-09-28 ENCOUNTER — Encounter: Payer: Self-pay | Admitting: Family

## 2024-09-28 ENCOUNTER — Ambulatory Visit: Admitting: Family

## 2024-09-28 VITALS — HR 85 | Temp 98.5°F | Resp 20 | Wt 376.0 lb

## 2024-09-28 DIAGNOSIS — K219 Gastro-esophageal reflux disease without esophagitis: Secondary | ICD-10-CM | POA: Diagnosis not present

## 2024-09-28 DIAGNOSIS — B999 Unspecified infectious disease: Secondary | ICD-10-CM

## 2024-09-28 DIAGNOSIS — J302 Other seasonal allergic rhinitis: Secondary | ICD-10-CM

## 2024-09-28 DIAGNOSIS — J4530 Mild persistent asthma, uncomplicated: Secondary | ICD-10-CM

## 2024-09-28 DIAGNOSIS — J3089 Other allergic rhinitis: Secondary | ICD-10-CM | POA: Diagnosis not present

## 2024-09-28 MED ORDER — FLUTICASONE PROPIONATE HFA 44 MCG/ACT IN AERO: 2.0000 | INHALATION_SPRAY | Freq: Two times a day (BID) | RESPIRATORY_TRACT | 5 refills | Status: AC

## 2024-09-28 MED ORDER — CARBINOXAMINE MALEATE 4 MG PO TABS: ORAL_TABLET | ORAL | 5 refills | Status: AC

## 2024-09-28 MED ORDER — ALBUTEROL SULFATE HFA 108 (90 BASE) MCG/ACT IN AERS: INHALATION_SPRAY | RESPIRATORY_TRACT | 1 refills | Status: AC

## 2024-09-28 NOTE — Progress Notes (Signed)
 [address] [place] [address] Dept: [phone]  FOLLOW UP NOTE  Patient ID: Jennifer [name], female    DOB: 23-May-1966  Age: 58 y.o. MRN: [id number] Date of Office Visit: 09/28/2024  Assessment  Chief Complaint: Follow-up (Doing good, no flares - pt not taking maintenance inhaler daily, taking as needed)  HPI [name] is a 58 year old female who presents today for follow-up of mild persistent asthma without complication, seasonal and perennial allergic rhinitis, gastroesophageal reflux, and recurrent sinus infections and bronchitis.  She was last seen on Mar 10, 2024 by myself.  She reports that her knee continues to get worse, but she has to lose weight before doing surgery.  She is now seeing bariatrics with plans to potentially have gastric sleeve surgery.  So since her last office visit she was referred to see cardiology on July 19, 2024 due to elevated BNP on April 25, 2024 and echocardiogram from August 2005 showing normal left ventricular ejection fraction grade 1 diastolic dysfunction.  Her office visit from July 19, 2024 shows: A/P   1 peripheral edema-likely secondary to venous insufficiency/morbid obesity.  LV function normal.  proBNP minimally elevated.  Continue diuretic.   2 hypertension-blood pressure is mildly elevated.  I will ask her to follow this.  Her regimen can be advanced with goal systolic blood pressure of 130 or less and diastolic less than 85.   3 morbid obesity-she is being evaluated for possible gastric sleeve.  I think she can proceed without further cardiac testing.   4 hyperlipidemia-continue statin.  We discussed a calcium  score today for risk stratification but she stated expense was prohibitive.  Recurrent sinus infections and bronchitis: She reports that she has not had any bronchitis or sinus infections.  She does have a dry cough when lying down and she will feel short of breath when going up stairs.  She denies wheezing, tightness in  chest, and nocturnal awakenings due to breathing problems.  She has not required any systemic steroids or made any trips to the emergency room or urgent care due to breathing problems.  She has not been using Flovent  44 mcg 2 puffs twice a day as recommended.  She has just been using it as needed.  She does not think that she has an albuterol  inhaler.  Allergic rhinitis: She reports a little bit of postnasal drip and denies rhinorrhea and nasal congestion.  She has not been treated for any sinus infections since we last saw her.  She does use carbinoxamine  as needed and fluticasone  nasal spray as needed.  Gastroesophageal reflux disease: She does take pantoprazole  as needed.  She reports that her reflux has been better.  She has been trying to stay away from greasy foods and acidic foods.  She is also eating dinner by 6 PM.   Drug Allergies:  Allergies[1]  Review of Systems: Negative except as per HPI   Physical Exam: Pulse 85   Temp 98.5 F (36.9 C) (Oral)   Resp 20   Wt (!) 376 lb (170.6 kg)   LMP 04/07/2017   SpO2 98%   BMI 73.43 kg/m    Physical Exam Constitutional:      Appearance: Normal appearance.  HENT:     Head: Normocephalic and atraumatic.     Comments: Pharynx normal, eyes normal, ears normal, nose normal    Right Ear: Tympanic membrane, ear canal and external ear normal.     Left Ear: Tympanic membrane, ear canal and external ear  normal.     Nose: Nose normal.     Mouth/Throat:     Mouth: Mucous membranes are moist.     Pharynx: Oropharynx is clear.  Eyes:     Conjunctiva/sclera: Conjunctivae normal.  Cardiovascular:     Rate and Rhythm: Regular rhythm.     Heart sounds: Normal heart sounds.  Pulmonary:     Effort: Pulmonary effort is normal.     Breath sounds: Normal breath sounds.     Comments: Lungs clear to auscultation Musculoskeletal:     Cervical back: Neck supple.  Skin:    General: Skin is warm.  Neurological:     Mental Status: She is alert  and oriented to person, place, and time.  Psychiatric:        Mood and Affect: Mood normal.        Behavior: Behavior normal.        Thought Content: Thought content normal.        Judgment: Judgment normal.     Diagnostics: FVC 2.69 L (113%), FEV1 1.74 L (91%), FEV1/FVC 0.65.  Spirometry indicates normal spirometry.  Assessment and Plan: 1. Seasonal and perennial allergic rhinitis   2. Mild persistent asthma without complication   3. Gastroesophageal reflux disease without esophagitis   4. Recurrent infections     Meds ordered this encounter  Medications   Carbinoxamine  Maleate 4 MG TABS    Sig: Take 1 tablet by mouth 2-3 times a day as needed for runny nose/drainage down throat    Dispense:  90 tablet    Refill:  5   fluticasone  (FLOVENT  HFA) 44 MCG/ACT inhaler    Sig: Inhale 2 puffs into the lungs 2 (two) times daily. Inhale 2 puffs twice a day with spacer to help prevent cough and wheeze.  Rinse mouth out afterwards    Dispense:  1 each    Refill:  5   albuterol  (VENTOLIN  HFA) 108 (90 Base) MCG/ACT inhaler    Sig: Inhale 2 puffs every 4-6 hours as needed for cough, wheeze, tightness in chest, shortness of breath    Dispense:  8 g    Refill:  1    Patient Instructions  Recurrent sinus infections and bronchitis Past history: [name] experiences recurrent sinus infections and bronchitis, leading to multiple ER visits. Symptoms include rhinorrhea, nasal congestion, epiphora, dry cough, and production of thick green sputum, worsening in fall and winter and improving in summer.   - Start flovent  (fluticasone ) 44mcg 2 puffs twice daily. Make sure and use this every day - Continue albuterol  2 puffs every 4-6 hours as needed for cough, wheeze, dyspnea   Allergic rhinitis Past history: [name] presents with allergic rhinitis, characterized by rhinorrhea, nasal congestion, epiphora, and post-nasal drip. Previous allergy  testing indicated sensitivities to [place] grass,  Cladosporium, dust mite, dog, [place], and Mucor. Current management includes cetirizine and fluticasone , but cetirizine is no longer effective. Immunotherapy is a potential treatment to retrain the immune system and potentially induce tolerance to allergens such as dog. - Skin testing on 01/24/24 was positive to meadow fescue and perennial rye with adequate controls. Intradermal skin testing was positive to dust mite mix, cat hair, and dog epithelia - Continue carbinoxamine  4mg  2-3 times per day - Continue flonase  1 spray per nostril twice daily  - Continue allergen avoidance measures as below - Consider immunotherapy to retrain the immune system and potentially induce tolerance to allergens.  Gastroesophageal reflux disease (GERD)  Past history: Sofiah's GERD is managed with pantoprazole   and over-the-counter calcium  carbonate. Symptoms are generally well-controlled, with pantoprazole  used as needed for severe symptoms.Interim history : worsening symptoms since off pantoprazole  - Continue pantoprazole  as needed.  Follow up:6 months or sooner if needed  Reducing Pollen Exposure The American Academy of All[employer] , Asthma [employer] Immun[employer] suggests the following steps to reduce your exposure to pollen during allergy  seasons. Do not hang sheets or clothing out to dry; pollen may collect on these items. Do not mow lawns or spend time around freshly cut grass; mowing stirs up pollen. Keep windows closed at night.  Keep car windows closed while driving. Minimize morning activities outdoors, a time when pollen counts are usually at their highest. Stay indoors as much as possible when pollen counts or humidity is high and on windy days when pollen tends to remain in the air longer. Use air conditioning when possible.  Many air conditioners have filters that trap the pollen spores. Use a HEPA room air filter to remove pollen form the indoor air you breathe.  Control of Dust Mite Allergen Dust mites play a  major role in allergic asthma and rhinitis. They occur in environments with high humidity wherever human skin is found. Dust mites absorb humidity from the atmosphere (ie, they do not drink) and feed on organic matter (including shed human and animal skin). Dust mites are a microscopic type of insect that you cannot see with the naked eye. High levels of dust mites have been detected from mattresses, pillows, carpets, upholstered furniture, bed covers, clothes, soft toys and any woven material. The principal allergen of the dust mite is found in its feces. A gram of dust may contain 1,000 mites and 250,000 fecal particles. Mite antigen is easily measured in the air during house cleaning activities. Dust mites do not bite and do not cause harm to humans, other than by triggering allergies/asthma.  Ways to decrease your exposure to dust mites in your home:  1. Encase mattresses, box springs and pillows with a mite-impermeable barrier or cover  2. Wash sheets, blankets and drapes weekly in hot water (130 F) with detergent and dry them in a dryer on the hot setting.  3. Have the room cleaned frequently with a vacuum cleaner and a damp dust-mop. For carpeting or rugs, vacuuming with a vacuum cleaner equipped with a high-efficiency particulate air (HEPA) filter. The dust mite allergic individual should not be in a room which is being cleaned and should wait 1 hour after cleaning before going into the room.  4. Do not sleep on upholstered furniture (eg, couches).  5. If possible removing carpeting, upholstered furniture and drapery from the home is ideal. Horizontal blinds should be eliminated in the rooms where the person spends the most time (bedroom, study, television room). Washable vinyl, roller-type shades are optimal.  6. Remove all non-washable stuffed toys from the bedroom. Wash stuffed toys weekly like sheets and blankets above.  7. Reduce indoor humidity to less than 50%. Inexpensive humidity  monitors can be purchased at most hardware stores. Do not use a humidifier as can make the problem worse and are not recommended.   Control of Dog or Cat Allergen Avoidance is the best way to manage a dog or cat allergy . If you have a dog or cat and are allergic to dog or cats, consider removing the dog or cat from the home. If you have a dog or cat but dont want to find it a new home, or if your family wants a pet even  though someone in the household is allergic, here are some strategies that may help keep symptoms at bay:  Keep the pet out of your bedroom and restrict it to only a few rooms. Be advised that keeping the dog or cat in only one room will not limit the allergens to that room. Dont pet, hug or kiss the dog or cat; if you do, wash your hands with soap and water. High-efficiency particulate air (HEPA) cleaners run continuously in a bedroom or living room can reduce allergen levels over time. Regular use of a high-efficiency vacuum cleaner or a central vacuum can reduce allergen levels. Giving your dog or cat a bath at least once a week can reduce airborne allergen.  Return in about 6 months (around 03/29/2025), or if symptoms worsen or fail to improve.    Thank you for the opportunity to care for this patient.  Please do not hesitate to contact me with questions.  Wanda Craze, FNP [facility]         [1]  Allergies Allergen Reactions   Latex Other (See Comments) and Hives    Powder causes hives  hives

## 2024-10-09 ENCOUNTER — Ambulatory Visit: Payer: Self-pay | Admitting: Nurse Practitioner

## 2024-10-09 ENCOUNTER — Encounter: Payer: Self-pay | Admitting: Nurse Practitioner

## 2024-10-09 VITALS — BP 139/78 | HR 81 | Temp 97.1°F | Wt 370.4 lb

## 2024-10-09 DIAGNOSIS — M25561 Pain in right knee: Secondary | ICD-10-CM | POA: Diagnosis not present

## 2024-10-09 DIAGNOSIS — M25562 Pain in left knee: Secondary | ICD-10-CM

## 2024-10-09 DIAGNOSIS — M170 Bilateral primary osteoarthritis of knee: Secondary | ICD-10-CM

## 2024-10-09 DIAGNOSIS — R600 Localized edema: Secondary | ICD-10-CM

## 2024-10-09 DIAGNOSIS — Z23 Encounter for immunization: Secondary | ICD-10-CM | POA: Diagnosis not present

## 2024-10-09 DIAGNOSIS — M25511 Pain in right shoulder: Secondary | ICD-10-CM | POA: Insufficient documentation

## 2024-10-09 DIAGNOSIS — K219 Gastro-esophageal reflux disease without esophagitis: Secondary | ICD-10-CM | POA: Diagnosis not present

## 2024-10-09 DIAGNOSIS — G8929 Other chronic pain: Secondary | ICD-10-CM

## 2024-10-09 DIAGNOSIS — E7800 Pure hypercholesterolemia, unspecified: Secondary | ICD-10-CM | POA: Diagnosis not present

## 2024-10-09 DIAGNOSIS — I10 Essential (primary) hypertension: Secondary | ICD-10-CM

## 2024-10-09 MED ORDER — ATORVASTATIN CALCIUM 10 MG PO TABS: 10.0000 mg | ORAL_TABLET | Freq: Every day | ORAL | 1 refills | Status: AC

## 2024-10-09 MED ORDER — METHOCARBAMOL 500 MG PO TABS: 500.0000 mg | ORAL_TABLET | Freq: Three times a day (TID) | ORAL | 0 refills | Status: AC | PRN

## 2024-10-09 NOTE — Assessment & Plan Note (Addendum)
" °  No edema noted today Continue hydrochlorothiazide  12.5 mg daily for hypertension "

## 2024-10-09 NOTE — Assessment & Plan Note (Signed)
 Gastroesophageal reflux disease GERD managed with pantoprazole  as needed. Famotidine  not in use. - Continue pantoprazole  40mg  daily  as needed. - Discontinued famotidine .

## 2024-10-09 NOTE — Assessment & Plan Note (Signed)
"   Pain managed with etodolac and muscle relaxants. - Refilled muscle relaxant prescription.  "

## 2024-10-09 NOTE — Assessment & Plan Note (Signed)
 Wt Readings from Last 3 Encounters:  10/09/24 (!) 370 lb 6.4 oz (168 kg)  09/28/24 (!) 376 lb (170.6 kg)  07/19/24 (!) 372 lb (168.7 kg)   Body mass index is 72.34 kg/m.   Undergoing evaluation for bariatric surgery. Attending psychological evaluation and nutritional counseling. Engaging in some physical activity. - Continue with bariatric surgery evaluation process. - Encouraged weight loss efforts and physical activity. - Advised on heart-healthy diet.

## 2024-10-09 NOTE — Assessment & Plan Note (Addendum)
 Lab Results  Component Value Date   CHOL 172 01/15/2023   HDL 52 01/15/2023   LDLCALC 108 (H) 01/15/2023   TRIG 64 01/15/2023   CHOLHDL 3.3 01/15/2023    Inconsistent use of atorvastatin . Last cholesterol check in 2020. No recent blood work. - Refilled atorvastatin  prescription. - Ordered lipid panel - Encouraged consistent daily use of atorvastatin .

## 2024-10-09 NOTE — Assessment & Plan Note (Signed)
 Bilateral primary osteoarthritis of knee Severe osteoarthritis confirmed. Pain managed with etodolac and muscle relaxants. No rheumatoid arthritis suspected. - Refilled muscle relaxant prescription. - Encouraged continuation of knee injections every three months. - Screened for rheumatoid arthritis.

## 2024-10-09 NOTE — Progress Notes (Signed)
 "  Established Patient Office Visit  Subjective:  Patient ID: Jennifer [name], female    DOB: [birth year]  Age: 58 y.o. MRN: [id number]  CC:  Chief Complaint  Patient presents with   Hypertension    4 month. Wondering if needing RA for knee pain.     HPI   Discussed the use of AI scribe software for clinical note transcription with the patient, who gave verbal consent to proceed.  History of Present Illness Jennifer [name] is a 58 year old female  has a past medical history of Arthritis, Eczema, GERD (gastroesophageal reflux disease), High cholesterol, Hypertension, Obesity, Seasonal allergies, and Vitamin D  deficiency. osteoarthritis who presents for a four month follow-up.  Her blood pressure has been elevated, with a recent reading of 139/70 mmHg. She has not been taking hydrochlorothiazide  consistently due to its diuretic effect, which she finds inconvenient when going out. She takes atorvastatin  10 mg for cholesterol management but has been missing doses.    She experiences pain in both knees and her left shoulder. She reports that the orthopedics team told her she has severe osteoarthritis in both knees and she receives injections every three months, with the last one in December. She reports that an x-ray of her shoulder showed some wear and tear, but nothing severe. She has been prescribed etodolac for knee and shoulder pain and is out of her muscle relaxant and needs a refill. No use of ibuprofen .  She takes pantoprazole  as needed for acid reflux, which was prescribed by her allergy  doctor. She no longer takes famotidine , which was initially given during a hospital stay.  She is considering bariatric surgery and has undergone a psychological evaluation. She is working on weight loss and has been doing arm exercises and chair aerobics. She uses a wheelchair for long distances due to knee pain but uses a rollator at home.  Her daughter suggested seeing a rheumatologist for a  comprehensive evaluation of her arthritis, but she has not pursued this yet.    Assessment & Plan      Past Medical History:  Diagnosis Date   Arthritis    Eczema    GERD (gastroesophageal reflux disease)    High cholesterol    Hypertension    Obesity    Seasonal allergies    Vitamin D  deficiency     Past Surgical History:  Procedure Laterality Date   CESAREAN SECTION     TONSILLECTOMY     TUBAL LIGATION      Family History  Problem Relation Age of Onset   Hepatitis B Mother    High blood pressure Mother    GER disease Father    High blood pressure Father    Allergic rhinitis Sister    Allergic rhinitis Sister    Sinusitis Sister    Colon cancer Daughter    Angioedema Neg Hx    Eczema Neg Hx    Urticaria Neg Hx    Immunodeficiency Neg Hx     Social History   Socioeconomic History   Marital status: Divorced    Spouse name: Not on file   Number of children: 2   Years of education: Not on file   Highest education level: Some college, no degree  Occupational History   Not on file  Tobacco Use   Smoking status: Never   Smokeless tobacco: Never  Vaping Use   Vaping status: Never Used  Substance and Sexual Activity   Alcohol use: No   Drug use: No  Sexual activity: Yes    Birth control/protection: None, Surgical  Other Topics Concern   Not on file  Social History Narrative   Lives with grandchildren.    Social Drivers of Health   Tobacco Use: Low Risk (10/09/2024)   Patient History    Smoking Tobacco Use: Never    Smokeless Tobacco Use: Never    Passive Exposure: Not on file  Financial Resource Strain: High Risk (02/29/2024)   Overall Financial Resource Strain (CARDIA)    Difficulty of Paying Living Expenses: Very hard  Food Insecurity: No Food Insecurity (10/09/2024)   Epic    Worried About Radiation Protection Practitioner of Food in the Last Year: Never true    Ran Out of Food in the Last Year: Never true  Transportation Needs: No Transportation Needs  (10/09/2024)   Epic    Lack of Transportation (Medical): No    Lack of Transportation (Non-Medical): No  Physical Activity: Unknown (02/29/2024)   Exercise Vital Sign    Days of Exercise per Week: 0 days    Minutes of Exercise per Session: Not on file  Stress: No Stress Concern Present (02/29/2024)   [employer] of Occupational Health - Occupational Stress Questionnaire    Feeling of Stress : Not at all  Social Connections: Moderately Isolated (02/29/2024)   Social Connection and Isolation Panel    Frequency of Communication with Friends and Family: More than three times a week    Frequency of Social Gatherings with Friends and Family: Twice a week    Attends Religious Services: More than 4 times per year    Active Member of [employer] or Organizations: No    Attends Banker Meetings: Not on file    Marital Status: Separated  Intimate Partner Violence: Not At Risk (10/09/2024)   Epic    Fear of Current or Ex-Partner: No    Emotionally Abused: No    Physically Abused: No    Sexually Abused: No  Depression (PHQ2-9): Low Risk (06/09/2024)   Depression (PHQ2-9)    PHQ-2 Score: 0  Alcohol Screen: Not on file  Housing: Low Risk (10/09/2024)   Epic    Unable to Pay for Housing in the Last Year: No    Number of Times Moved in the Last Year: 0    Homeless in the Last Year: No  Utilities: Not At Risk (10/09/2024)   Epic    Threatened with loss of utilities: No  Health Literacy: Not on file    Outpatient Medications Prior to Visit  Medication Sig Dispense Refill   acetaminophen  (TYLENOL ) 500 MG tablet Frequency:   Dosage:0   MG  Instructions:  Note:1 tab by mouth as needed for pain every 4 hours (Tylenol ),take with food.     albuterol  (PROVENTIL ) (5 MG/ML) 0.5% nebulizer solution Take 0.5 mLs (2.5 mg total) by nebulization every 6 (six) hours as needed for wheezing or shortness of breath. 20 mL 12   albuterol  (VENTOLIN  HFA) 108 (90 Base) MCG/ACT inhaler Inhale 2 puffs every  4-6 hours as needed for cough, wheeze, tightness in chest, shortness of breath 8 g 1   Carbinoxamine  Maleate 4 MG TABS Take 1 tablet by mouth 2-3 times a day as needed for runny nose/drainage down throat 90 tablet 5   etodolac (LODINE) 300 MG capsule Take 300 mg by mouth.     fluticasone  (FLONASE ) 50 MCG/ACT nasal spray Place 1 spray into both nostrils 2 (two) times daily. 15.8 mL 3   fluticasone  (FLOVENT  HFA) 44  MCG/ACT inhaler Inhale 2 puffs into the lungs 2 (two) times daily. Inhale 2 puffs twice a day with spacer to help prevent cough and wheeze.  Rinse mouth out afterwards 1 each 5   Misc. Devices (ROLLATOR ULTRA-LIGHT) MISC 1 each by Does not apply route as needed. 1 each EACH   Potassium Chloride CRYS by Does not apply route.     atorvastatin  (LIPITOR) 10 MG tablet Take 1 tablet (10 mg total) by mouth daily. 90 tablet 1   furosemide  (LASIX ) 20 MG tablet Take 1 tablet (20 mg total) by mouth daily. 30 tablet 3   metFORMIN  (GLUCOPHAGE ) 500 MG tablet Take 1 tablet (500 mg total) by mouth 2 (two) times daily with a meal. (Patient not taking: Reported on 09/28/2024) 180 tablet 2   pantoprazole  (PROTONIX ) 40 MG tablet Take 1 tablet (40 mg total) by mouth daily. (Patient not taking: Reported on 10/09/2024) 30 tablet 5   famotidine  (PEPCID ) 20 MG tablet Take 1 tablet (20 mg total) by mouth 2 (two) times daily. (Patient not taking: Reported on 10/09/2024) 30 tablet 0   hydrochlorothiazide  (HYDRODIURIL ) 12.5 MG tablet Take 1 tablet (12.5 mg total) by mouth daily. (Patient not taking: Reported on 10/09/2024) 90 tablet 1   ibuprofen  (ADVIL ) 800 MG tablet Take 1 tablet (800 mg total) by mouth every 8 (eight) hours as needed for moderate pain (pain score 4-6). (Patient not taking: Reported on 10/09/2024) 30 tablet 1   methocarbamol  (ROBAXIN ) 500 MG tablet TAKE 2 TABLETS BY MOUTH EVERY 8 HOURS AS NEEDED FOR  MUSCLE  SPASMS (Patient not taking: Reported on 10/09/2024) 60 tablet 0   Facility-Administered  Medications Prior to Visit  Medication Dose Route Frequency Provider Last Rate Last Admin   triamcinolone  ointment (KENALOG ) 0.1 %   Topical BID Iva Marty Saltness, MD        Allergies[1]  ROS Review of Systems    Objective:    Physical Exam Vitals and nursing note reviewed.  Constitutional:      General: She is not in acute distress.    Appearance: Normal appearance. She is obese. She is not ill-appearing, toxic-appearing or diaphoretic.  Eyes:     General: No scleral icterus.       Right eye: No discharge.        Left eye: No discharge.     Extraocular Movements: Extraocular movements intact.     Conjunctiva/sclera: Conjunctivae normal.  Cardiovascular:     Rate and Rhythm: Normal rate and regular rhythm.     Pulses: Normal pulses.     Heart sounds: Normal heart sounds. No murmur heard.    No friction rub. No gallop.  Pulmonary:     Effort: Pulmonary effort is normal. No respiratory distress.     Breath sounds: Normal breath sounds. No stridor. No wheezing, rhonchi or rales.  Chest:     Chest wall: No tenderness.  Abdominal:     General: There is no distension.     Palpations: Abdomen is soft.     Tenderness: There is no abdominal tenderness. There is no right CVA tenderness, left CVA tenderness or guarding.  Musculoskeletal:        General: Tenderness present. No swelling, deformity or signs of injury.     Right lower leg: No edema.     Left lower leg: No edema.     Comments: Tenderness of range of motion of right shoulder Patient sitting in a chair  Skin:    General: Skin is  warm and dry.     Capillary Refill: Capillary refill takes less than 2 seconds.     Coloration: Skin is not jaundiced or pale.     Findings: No bruising, erythema or lesion.  Neurological:     Mental Status: She is alert and oriented to person, place, and time.  Psychiatric:        Mood and Affect: Mood normal.        Behavior: Behavior normal.        Thought Content: Thought content  normal.        Judgment: Judgment normal.     BP 139/78 (BP Location: Right Wrist, Patient Position: Sitting)   Pulse 81   Temp (!) 97.1 F (36.2 C) (Temporal)   Wt (!) 370 lb 6.4 oz (168 kg)   LMP 04/07/2017   SpO2 100%   BMI 72.34 kg/m  Wt Readings from Last 3 Encounters:  10/09/24 (!) 370 lb 6.4 oz (168 kg)  09/28/24 (!) 376 lb (170.6 kg)  07/19/24 (!) 372 lb (168.7 kg)    No results found for: TSH Lab Results  Component Value Date   WBC 7.1 04/25/2024   HGB 11.5 (L) 04/25/2024   HCT 35.0 (L) 04/25/2024   MCV 91.9 04/25/2024   PLT 204 04/25/2024   Lab Results  Component Value Date   NA 140 06/09/2024   K 4.2 06/09/2024   CO2 22 06/09/2024   GLUCOSE 102 (H) 06/09/2024   BUN 17 06/09/2024   CREATININE 0.80 06/09/2024   BILITOT 0.9 12/05/2023   ALKPHOS 75 12/05/2023   AST 18 12/05/2023   ALT 16 12/05/2023   PROT 7.2 12/05/2023   ALBUMIN 3.1 (L) 12/05/2023   CALCIUM  9.2 06/09/2024   ANIONGAP 12 04/25/2024   EGFR 85 06/09/2024   Lab Results  Component Value Date   CHOL 172 01/15/2023   Lab Results  Component Value Date   HDL 52 01/15/2023   Lab Results  Component Value Date   LDLCALC 108 (H) 01/15/2023   Lab Results  Component Value Date   TRIG 64 01/15/2023   Lab Results  Component Value Date   CHOLHDL 3.3 01/15/2023   No results found for: HGBA1C    Assessment & Plan:   Problem List Items Addressed This Visit       Cardiovascular and Mediastinum   Hypertension, essential - Primary   BP Readings from Last 3 Encounters:  10/09/24 139/78  07/19/24 (!) 147/71  06/09/24 (!) 143/63   Blood pressure elevated. Inconsistent use of hydrochlorothiazide  due to side effects. Furosemide  not taken due to adverse effects with hydrochlorothiazide . - Refilled hydrochlorothiazide  prescription. - Encouraged consistent use of hydrochlorothiazide  12.5mg  daily . - Advised on heart-healthy, low-salt, low-fat diet for blood pressure  management. Recommended moderate exercise at least 150 minutes weekly as tolerated      Relevant Medications   hydrochlorothiazide  (HYDRODIURIL ) 12.5 MG tablet   atorvastatin  (LIPITOR) 10 MG tablet   Other Relevant Orders   CMP14+EGFR   CBC     Digestive   Esophageal reflux   Gastroesophageal reflux disease GERD managed with pantoprazole  as needed. Famotidine  not in use. - Continue pantoprazole  40mg  daily  as needed. - Discontinued famotidine .               Musculoskeletal and Integument   Knee osteoarthritis   Relevant Medications   etodolac (LODINE) 300 MG capsule   methocarbamol  (ROBAXIN ) 500 MG tablet   Other Relevant Orders   C-reactive protein  Sedimentation Rate   Rheumatoid factor     Other   Hypercholesteremia   Lab Results  Component Value Date   CHOL 172 01/15/2023   HDL 52 01/15/2023   LDLCALC 108 (H) 01/15/2023   TRIG 64 01/15/2023   CHOLHDL 3.3 01/15/2023    Inconsistent use of atorvastatin . Last cholesterol check in 2020. No recent blood work. - Refilled atorvastatin  prescription. - Ordered lipid panel - Encouraged consistent daily use of atorvastatin .       Relevant Medications   hydrochlorothiazide  (HYDRODIURIL ) 12.5 MG tablet   atorvastatin  (LIPITOR) 10 MG tablet   Other Relevant Orders   Lipid panel   Morbid obesity (HCC)   Wt Readings from Last 3 Encounters:  10/09/24 (!) 370 lb 6.4 oz (168 kg)  09/28/24 (!) 376 lb (170.6 kg)  07/19/24 (!) 372 lb (168.7 kg)   Body mass index is 72.34 kg/m.   Undergoing evaluation for bariatric surgery. Attending psychological evaluation and nutritional counseling. Engaging in some physical activity. - Continue with bariatric surgery evaluation process. - Encouraged weight loss efforts and physical activity. - Advised on heart-healthy diet.       Bilateral leg edema    No edema noted today Continue hydrochlorothiazide  12.5 mg daily for hypertension      Relevant Medications    hydrochlorothiazide  (HYDRODIURIL ) 12.5 MG tablet   Chronic pain of both knees   Bilateral primary osteoarthritis of knee Severe osteoarthritis confirmed. Pain managed with etodolac and muscle relaxants. No rheumatoid arthritis suspected. - Refilled muscle relaxant prescription. - Encouraged continuation of knee injections every three months. - Screened for rheumatoid arthritis.       Relevant Medications   etodolac (LODINE) 300 MG capsule   methocarbamol  (ROBAXIN ) 500 MG tablet   Need for influenza vaccination   Relevant Orders   Flu vaccine trivalent PF, 6mos and older(Flulaval,Afluria,Fluarix,Fluzone)   Right shoulder pain    Pain managed with etodolac and muscle relaxants. - Refilled muscle relaxant prescription.       Relevant Medications   etodolac (LODINE) 300 MG capsule   methocarbamol  (ROBAXIN ) 500 MG tablet    Meds ordered this encounter  Medications   methocarbamol  (ROBAXIN ) 500 MG tablet    Sig: Take 1 tablet (500 mg total) by mouth every 8 (eight) hours as needed for muscle spasms.    Dispense:  60 tablet    Refill:  0   hydrochlorothiazide  (HYDRODIURIL ) 12.5 MG tablet    Sig: Take 1 tablet (12.5 mg total) by mouth daily.    Dispense:  90 tablet    Refill:  1   atorvastatin  (LIPITOR) 10 MG tablet    Sig: Take 1 tablet (10 mg total) by mouth daily.    Dispense:  90 tablet    Refill:  1    Follow-up: Return in about 4 months (around 02/07/2025) for HTN.    Malakhai Beitler R Pattrick Bady, FNP    [1]  Allergies Allergen Reactions   Latex Other (See Comments) and Hives    Powder causes hives  hives   "

## 2024-10-09 NOTE — Patient Instructions (Signed)
 1. Osteoarthritis of both knees, unspecified osteoarthritis type - C-reactive protein - Sedimentation Rate - Rheumatoid factor - methocarbamol  (ROBAXIN ) 500 MG tablet; Take 1 tablet (500 mg total) by mouth every 8 (eight) hours as needed for muscle spasms.  Dispense: 60 tablet; Refill: 0  2. Hypertension, essential (Primary) - CMP14+EGFR - CBC - hydrochlorothiazide  (HYDRODIURIL ) 12.5 MG tablet; Take 1 tablet (12.5 mg total) by mouth daily.  Dispense: 90 tablet; Refill: 1  3. Hypercholesteremia - Lipid panel - atorvastatin  (LIPITOR) 10 MG tablet; Take 1 tablet (10 mg total) by mouth daily.  Dispense: 90 tablet; Refill: 1  4. Need for influenza vaccination - Flu vaccine trivalent PF, 6mos and older(Flulaval,Afluria,Fluarix,Fluzone)  5. Bilateral leg edema - hydrochlorothiazide  (HYDRODIURIL ) 12.5 MG tablet; Take 1 tablet (12.5 mg total) by mouth daily.  Dispense: 90 tablet; Refill: 1      It is important that you exercise regularly at least 30 minutes 5 times a week as tolerated  Think about what you will eat, plan ahead. Choose  clean, green, fresh or frozen over canned, processed or packaged foods which are more sugary, salty and fatty. 70 to 75% of food eaten should be vegetables and fruit. Three meals at set times with snacks allowed between meals, but they must be fruit or vegetables. Aim to eat over a 12 hour period , example 7 am to 7 pm, and STOP after  your last meal of the day. Drink water,generally about 64 ounces per day, no other drink is as healthy. Fruit juice is best enjoyed in a healthy way, by EATING the fruit.  Thanks for choosing [facility] we consider it a privelige to serve you.

## 2024-10-09 NOTE — Assessment & Plan Note (Signed)
 BP Readings from Last 3 Encounters:  10/09/24 139/78  07/19/24 (!) 147/71  06/09/24 (!) 143/63   Blood pressure elevated. Inconsistent use of hydrochlorothiazide  due to side effects. Furosemide  not taken due to adverse effects with hydrochlorothiazide . - Refilled hydrochlorothiazide  prescription. - Encouraged consistent use of hydrochlorothiazide  12.5mg  daily . - Advised on heart-healthy, low-salt, low-fat diet for blood pressure management. Recommended moderate exercise at least 150 minutes weekly as tolerated

## 2024-10-10 ENCOUNTER — Ambulatory Visit: Payer: Self-pay | Admitting: Nurse Practitioner

## 2024-10-10 DIAGNOSIS — M0580 Other rheumatoid arthritis with rheumatoid factor of unspecified site: Secondary | ICD-10-CM

## 2024-10-10 LAB — CBC
Hematocrit: 39.9 % (ref 34.0–46.6)
Hemoglobin: 12.5 g/dL (ref 11.1–15.9)
MCH: 29.6 pg (ref 26.6–33.0)
MCHC: 31.3 g/dL — ABNORMAL LOW (ref 31.5–35.7)
MCV: 94 fL (ref 79–97)
Platelets: 236 x10E3/uL (ref 150–450)
RBC: 4.23 x10E6/uL (ref 3.77–5.28)
RDW: 12.6 % (ref 11.7–15.4)
WBC: 5 x10E3/uL (ref 3.4–10.8)

## 2024-10-10 LAB — CMP14+EGFR
ALT: 14 IU/L (ref 0–32)
AST: 16 IU/L (ref 0–40)
Albumin: 3.5 g/dL — ABNORMAL LOW (ref 3.8–4.9)
Alkaline Phosphatase: 101 IU/L (ref 49–135)
BUN/Creatinine Ratio: 16 (ref 9–23)
BUN: 15 mg/dL (ref 6–24)
Bilirubin Total: 0.8 mg/dL (ref 0.0–1.2)
CO2: 23 mmol/L (ref 20–29)
Calcium: 9.3 mg/dL (ref 8.7–10.2)
Chloride: 103 mmol/L (ref 96–106)
Creatinine, Ser: 0.92 mg/dL (ref 0.57–1.00)
Globulin, Total: 3.9 g/dL (ref 1.5–4.5)
Glucose: 90 mg/dL (ref 70–99)
Potassium: 4.5 mmol/L (ref 3.5–5.2)
Sodium: 139 mmol/L (ref 134–144)
Total Protein: 7.4 g/dL (ref 6.0–8.5)
eGFR: 72 mL/min/1.73

## 2024-10-10 LAB — C-REACTIVE PROTEIN: CRP: 18 mg/L — ABNORMAL HIGH (ref 0–10)

## 2024-10-10 LAB — LIPID PANEL
Chol/HDL Ratio: 4.3 ratio (ref 0.0–4.4)
Cholesterol, Total: 178 mg/dL (ref 100–199)
HDL: 41 mg/dL
LDL Chol Calc (NIH): 127 mg/dL — ABNORMAL HIGH (ref 0–99)
Triglycerides: 52 mg/dL (ref 0–149)
VLDL Cholesterol Cal: 10 mg/dL (ref 5–40)

## 2024-10-10 LAB — RHEUMATOID FACTOR: Rheumatoid fact SerPl-aCnc: >650 [IU]/mL — ABNORMAL HIGH

## 2024-10-10 LAB — SEDIMENTATION RATE: Sed Rate: 73 mm/h — ABNORMAL HIGH (ref 0–40)

## 2025-01-24 ENCOUNTER — Ambulatory Visit: Admitting: Rheumatology

## 2025-02-07 ENCOUNTER — Ambulatory Visit: Payer: Self-pay | Admitting: Nurse Practitioner

## 2025-02-28 ENCOUNTER — Ambulatory Visit: Admitting: Rheumatology

## 2025-03-29 ENCOUNTER — Ambulatory Visit: Admitting: Family
